# Patient Record
Sex: Female | Born: 1965 | Hispanic: No | Smoking: Never smoker
Health system: Southern US, Community
[De-identification: ages and names within clinical notes are randomized; demographics above are authoritative.]

## PROBLEM LIST (undated history)

## (undated) DIAGNOSIS — M1990 Unspecified osteoarthritis, unspecified site: Secondary | ICD-10-CM

## (undated) DIAGNOSIS — E079 Disorder of thyroid, unspecified: Secondary | ICD-10-CM

## (undated) DIAGNOSIS — J302 Other seasonal allergic rhinitis: Secondary | ICD-10-CM

## (undated) HISTORY — DX: Unspecified osteoarthritis, unspecified site: M19.90

## (undated) HISTORY — DX: Other seasonal allergic rhinitis: J30.2

## (undated) HISTORY — DX: Disorder of thyroid, unspecified: E07.9

---

## 2003-12-28 ENCOUNTER — Other Ambulatory Visit: Admission: RE | Admit: 2003-12-28 | Discharge: 2003-12-28 | Payer: Self-pay | Admitting: Gynecology

## 2004-07-20 ENCOUNTER — Ambulatory Visit (HOSPITAL_COMMUNITY): Admission: RE | Admit: 2004-07-20 | Discharge: 2004-07-20 | Payer: Self-pay | Admitting: Chiropractic Medicine

## 2004-12-24 ENCOUNTER — Other Ambulatory Visit: Admission: RE | Admit: 2004-12-24 | Discharge: 2004-12-24 | Payer: Self-pay | Admitting: Obstetrics and Gynecology

## 2005-01-30 ENCOUNTER — Encounter: Admission: RE | Admit: 2005-01-30 | Discharge: 2005-01-30 | Payer: Self-pay | Admitting: Obstetrics and Gynecology

## 2006-03-21 ENCOUNTER — Other Ambulatory Visit: Admission: RE | Admit: 2006-03-21 | Discharge: 2006-03-21 | Payer: Self-pay | Admitting: Obstetrics and Gynecology

## 2007-06-10 ENCOUNTER — Encounter: Admission: RE | Admit: 2007-06-10 | Discharge: 2007-06-10 | Payer: Self-pay | Admitting: Endocrinology

## 2009-05-17 ENCOUNTER — Encounter: Admission: RE | Admit: 2009-05-17 | Discharge: 2009-05-17 | Payer: Self-pay | Admitting: Obstetrics and Gynecology

## 2010-12-23 ENCOUNTER — Encounter: Payer: Self-pay | Admitting: Obstetrics and Gynecology

## 2010-12-24 ENCOUNTER — Encounter: Payer: Self-pay | Admitting: Obstetrics and Gynecology

## 2015-12-29 ENCOUNTER — Encounter (INDEPENDENT_AMBULATORY_CARE_PROVIDER_SITE_OTHER): Payer: Self-pay | Admitting: Foot & Ankle Surgery

## 2015-12-29 ENCOUNTER — Ambulatory Visit (INDEPENDENT_AMBULATORY_CARE_PROVIDER_SITE_OTHER): Payer: BC Managed Care – PPO | Admitting: Foot & Ankle Surgery

## 2015-12-29 ENCOUNTER — Ambulatory Visit (INDEPENDENT_AMBULATORY_CARE_PROVIDER_SITE_OTHER): Payer: Self-pay | Admitting: Foot & Ankle Surgery

## 2015-12-29 VITALS — BP 134/70 | HR 68 | Ht 69.0 in | Wt 162.0 lb

## 2015-12-29 DIAGNOSIS — L851 Acquired keratosis [keratoderma] palmaris et plantaris: Secondary | ICD-10-CM

## 2015-12-29 DIAGNOSIS — M2022 Hallux rigidus, left foot: Secondary | ICD-10-CM | POA: Insufficient documentation

## 2015-12-29 DIAGNOSIS — M722 Plantar fascial fibromatosis: Secondary | ICD-10-CM

## 2015-12-29 NOTE — Progress Notes (Signed)
This is a 50 year old female.  She is a new patient.  She is here with a  chief complaint of left heel pain.  She feels like the pain has been  present for the last 2 weeks.  She denies any injury or trauma to the area.   She has had no swelling or bruising.  She has had a history of plantar  fasciitis in the past.  She got rid of it with stretching and wearing  better shoes and using arch supports.  She feels like it may be something  similar.  She also has some painful calluses on the balls of her feet.  She  had them trimmed down 4 or 5 years ago which seemed to really help with her  symptoms.  They keep coming back despite treatment.  She would like to have  these removed today.  She is having surgery likely this year with Dr. Jim Desanctis  to clean up her left great toe joint, which seems to be stiff and  arthritic.  She feels like as her motion is decreased in the big toe, the  heel pain has gotten a little bit worse.     All other systems reviewed and negative.     PHYSICAL EXAMINATION:  GENERAL:  She is awake, alert, oriented x3, no acute distress.  She is a  well-developed, well-nourished female.  LOWER EXTREMITY EXAM:  She has strong DP and PT pulses.  Capillary fill  time is immediate.  No edema, no ecchymosis or varicosities.  Skin is well  hydrated and intact.  She has multiple calluses on the ball of her foot  under the second and third metatarsal area which are somewhat painful with  palpation.  Post-debridement no evidence of skin breakdown.  She has  limited range of motion in the left great toe but no significant crepitus.   It is tender, and there is some bony spurring dorsally.  She has quite  significant tenderness through the plantar left medial heel at the  insertion of the plantar fascia and some mild equinus.  Muscle strength is  5/5 and symmetric.  She has no pain on the right foot.  Her sensation is  intact to light touch.  Negative Tinel sign.     ASSESSMENT:  A 50 year old female with left  plantar fasciitis, left hallux rigidus, and  painful calluses bilateral feet.     PLAN:  For the plantar fasciitis, we discussed conservative care today.  She is  wearing a good pair of shoes.  I have given her some other options for  over-the-counter arch supports including SuperFeet.  We discussed use in  breaking these in.  I have also given her a home stretching exercise  program today to do 3 times daily.  We discussed massage and ice and  anti-inflammatories if she needs them.  If she is not significantly better,  she may benefit from a steroid injection if she sees no improvement over  the next month.     A total of 4 calluses were sharply debrided from the balls of her bilateral  feet with a #10 scalpel today.  She had immediate improvement of her pain  post-debridement.     She can follow up with me on her plantar fasciitis or Dr. Jim Desanctis who is  going to be addressing her hallux rigidus on the left foot.

## 2018-03-31 ENCOUNTER — Other Ambulatory Visit (INDEPENDENT_AMBULATORY_CARE_PROVIDER_SITE_OTHER): Payer: Self-pay | Admitting: Physician Assistant

## 2019-05-19 LAB — COMPREHENSIVE METABOLIC PANEL
ALT: 20 IU/L (ref 0–32)
AST (SGOT): 21 IU/L (ref 0–40)
Albumin/Globulin Ratio: 1.8 (ref 1.2–2.2)
Albumin: 4.5 g/dL (ref 3.5–5.5)
Alkaline Phosphatase: 56 IU/L (ref 39–117)
BUN / Creatinine Ratio: 18 (ref 9–23)
BUN: 15 mg/dL (ref 6–24)
Bilirubin, Total: 0.6 mg/dL (ref 0.0–1.2)
CO2: 26 mmol/L (ref 20–29)
Calcium: 9.4 mg/dL (ref 8.7–10.2)
Chloride: 100 mmol/L (ref 96–106)
Creatinine: 0.82 mg/dL (ref 0.57–1.00)
EGFR: 82 mL/min/{1.73_m2} (ref >59)
EGFR: 94 mL/min/{1.73_m2} (ref >59)
Globulin, Total: 2.5 g/dL (ref 1.5–4.5)
Glucose: 94 mg/dL (ref 65–99)
Potassium: 4.1 mmol/L (ref 3.5–5.2)
Protein, Total: 7 g/dL (ref 6.0–8.5)
Sodium: 139 mmol/L (ref 134–144)

## 2019-05-19 LAB — LIPID PANEL, WITHOUT TOTAL CHOLESTEROL/HDL RATIO, SERUM
Cholesterol: 182 mg/dL (ref 100–199)
HDL: 72 mg/dL (ref >39)
LDL Calculated: 96 mg/dL (ref 0–99)
Triglycerides: 69 mg/dL (ref 0–149)
VLDL Calculated: 14 mg/dL (ref 5–40)

## 2019-05-19 LAB — CBC AND DIFFERENTIAL
Baso(Absolute): 0 10*3/uL (ref 0.0–0.2)
Basos: 0 %
Eos: 1 %
Eosinophils Absolute: 0 10*3/uL (ref 0.0–0.4)
Hematocrit: 44 % (ref 34.0–46.6)
Hemoglobin: 14.7 g/dL (ref 11.1–15.9)
Immature Granulocytes Absolute: 0 10*3/uL (ref 0.0–0.1)
Immature Granulocytes: 0 %
Lymphocytes Absolute: 2.2 10*3/uL (ref 0.7–3.1)
Lymphocytes: 49 %
MCH: 32 pg (ref 26.6–33.0)
MCHC: 33.4 g/dL (ref 31.5–35.7)
MCV: 96 fL (ref 79–97)
Monocytes Absolute: 0.4 10*3/uL (ref 0.1–0.9)
Monocytes: 8 %
Neutrophils Absolute: 1.9 10*3/uL (ref 1.4–7.0)
Neutrophils: 42 %
Platelets: 226 10*3/uL (ref 150–379)
RBC: 4.59 x10E6/uL (ref 3.77–5.28)
RDW: 12.6 % (ref 12.3–15.4)
WBC: 4.5 10*3/uL (ref 3.4–10.8)

## 2019-05-19 LAB — HEMOGLOBIN A1C: Hemoglobin A1C: 5 % (ref 4.8–5.6)

## 2019-05-19 LAB — TSH: TSH: 1.98 u[IU]/mL (ref 0.450–4.500)

## 2019-06-17 ENCOUNTER — Other Ambulatory Visit: Payer: Self-pay

## 2019-06-17 ENCOUNTER — Other Ambulatory Visit: Payer: Self-pay | Admitting: Obstetrics and Gynecology

## 2019-06-17 ENCOUNTER — Ambulatory Visit: Payer: Self-pay

## 2019-06-17 ENCOUNTER — Ambulatory Visit
Admission: RE | Admit: 2019-06-17 | Discharge: 2019-06-17 | Disposition: A | Discharge status: Home / Self Care | Payer: BC Managed Care – PPO | Source: Ambulatory Visit | Attending: Obstetrics and Gynecology | Admitting: Obstetrics and Gynecology

## 2019-06-17 DIAGNOSIS — R928 Other abnormal and inconclusive findings on diagnostic imaging of breast: Secondary | ICD-10-CM

## 2019-06-18 ENCOUNTER — Other Ambulatory Visit: Payer: Self-pay

## 2019-11-18 ENCOUNTER — Other Ambulatory Visit (INDEPENDENT_AMBULATORY_CARE_PROVIDER_SITE_OTHER): Payer: Self-pay | Admitting: Family Medicine

## 2020-01-05 ENCOUNTER — Encounter (INDEPENDENT_AMBULATORY_CARE_PROVIDER_SITE_OTHER): Payer: Self-pay | Admitting: Family Medicine

## 2021-04-04 ENCOUNTER — Ambulatory Visit: Payer: No Typology Code available for payment source | Attending: Family Medicine | Admitting: Physical Therapy

## 2021-04-04 ENCOUNTER — Other Ambulatory Visit: Payer: Self-pay

## 2021-04-04 ENCOUNTER — Encounter: Payer: Self-pay | Admitting: Physical Therapy

## 2021-04-04 DIAGNOSIS — M542 Cervicalgia: Secondary | ICD-10-CM | POA: Insufficient documentation

## 2021-04-04 DIAGNOSIS — M25511 Pain in right shoulder: Secondary | ICD-10-CM | POA: Insufficient documentation

## 2021-04-04 DIAGNOSIS — M6281 Muscle weakness (generalized): Secondary | ICD-10-CM | POA: Diagnosis present

## 2021-04-04 DIAGNOSIS — G8929 Other chronic pain: Secondary | ICD-10-CM | POA: Insufficient documentation

## 2021-04-04 NOTE — Patient Instructions (Signed)
Access Code: [email or web address] URL: [email or web address] Date: 04/04/2021 Prepared by: Karie Mainland  Exercises Seated Cervical Sidebending Stretch - 1-2 x daily - 7 x weekly - 1 sets - 3 reps - 30sec hold Gentle Levator Scapulae Stretch - 1-2 x daily - 7 x weekly - 1 sets - 3 reps - 30 hold Sidelying Thoracic Lumbar Rotation - 1-2 x daily - 7 x weekly - 2 sets - 10 reps - 30 hold Thoracic Extension Mobilization on Foam Roll - 1 x daily - 7 x weekly - 1 sets - 3-5 reps - 10 hold Doorway Pec Stretch at 90 Degrees Abduction - 1-2 x daily - 7 x weekly - 1 sets - 3 reps - 30 hold

## 2021-04-04 NOTE — Therapy (Addendum)
[place], [place], [address] Phone: [phone]   Fax:  [phone]  Physical Therapy Evaluation/Addended Discharge  Patient Details  Name: Erika Schultz MRN: [id number] Date of Birth: October 19, 1966 Referring Provider (PT): Dr. Melissa Montane   Encounter Date: 04/04/2021   PT End of Session - 04/04/21 1429     Visit Number 1    Number of Visits 8    Date for PT Re-Evaluation 05/30/21    Authorization Type [employer]    PT Start Time 1340    PT Stop Time 1430    PT Time Calculation (min) 50 min    Activity Tolerance Patient tolerated treatment well    Behavior During Therapy [facility] for tasks assessed/performed             History reviewed. No pertinent past medical history.  History reviewed. No pertinent surgical history.  There were no vitals filed for this visit.    Subjective Assessment - 04/04/21 1343     Subjective Patient has had chronic neck/shoulder pain which has been an issue for 2 + yrs.  She noticed a correlation with texting, using her phone, driving and lifting bags.  She had PT 2 yrs ago with resolution of sx with PT in [place] . She has continued to exercise but not her PT exercise.  She has noticed decr grip in Rt UE and hand.  Denies sensory changes. Pain has radiated to middle arm but not now. I want someone to get in there and dig on it.  Has had massages but no one has worked it "like you guys"    Pertinent History none    Limitations Reading;Other (comment)   certain WB exercises   Diagnostic tests none    Patient Stated Goals I want to get back to where I was so that I can get up from the ground, be balanced, resources for maintenance    Currently in Pain? Yes    Pain Score 1     Pain Location Shoulder    Pain Orientation Right    Pain Descriptors / Indicators Discomfort    Pain Type Chronic pain    Pain Radiating Towards none    Pain Onset More than a month ago    Pain  Frequency Constant    Aggravating Factors  pohone, computer    Pain Relieving Factors stretch, PT, heating pad, massage    Effect of Pain on Daily Activities limits her comfort, sleep    Multiple Pain Sites No                [facility] PT Assessment - 04/04/21 0001       Assessment   Medical Diagnosis cervicalgia, strain Rt side    Referring Provider (PT) Dr. Melissa Montane    Onset Date/Surgical Date --   chronic   Hand Dominance Right    Next MD Visit follow up as needed    Prior Therapy Yes      Precautions   Precautions None      Restrictions   Weight Bearing Restrictions No      Balance Screen   Has the patient fallen in the past 6 months No      [place] residence    Living Arrangements Spouse/significant other      Prior Function   Level of Independence Independent    Vocation Works at home    Leisure outdoors, boating, hiking  Cognition   Overall Cognitive Status Within Functional Limits for tasks assessed      Observation/Other Assessments   Focus on Therapeutic Outcomes (FOTO)  57%      Sensation   Light Touch Appears Intact      Coordination   Gross Motor Movements are Fluid and Coordinated Not tested      Posture/Postural Control   Posture/Postural Control Postural limitations    Postural Limitations Decreased thoracic kyphosis    Posture Comments good sitting posture, Rt shoulder lower      AROM   Overall AROM Comments WFL    Cervical Flexion 54   tightness   Cervical Extension 65    Cervical - Right Side Bend 40    Cervical - Left Side Bend 40    Cervical - Right Rotation WNL    Cervical - Left Rotation WNL   L slightly less than R     PROM   Overall PROM Comments WNL, mild pain Rt and Lt end range Er/Abd combined      Strength   Overall Strength Comments Rt 4/5 with pain, bilateral horizontal abd 4/5    Right Hand Grip (lbs) 68.7   average of 69,65,72   Left Hand Grip (lbs) 57   average of  58,58,55     Palpation   Spinal mobility hypomobile in middle to upper thoracic, and cervical spine    Palpation comment tender Rt upper trap, levator scap and Rt suboccipital, Rt supraspinatus painful      Special Tests    Special Tests Cervical;Rotator Cuff Impingement      Spurling's   Findings Negative      Vertebral Artery Test    Findings Negative      Neer Impingement test    Findings Positive    Side Right                        Objective measurements completed on examination: See above findings.       [facility] Adult PT Treatment/Exercise - 04/04/21 0001       Self-Care   Self-Care Posture;Other Self-Care Comments    Posture sitting, ergonomics, pillow for neck support, positoning    Other Self-Care Comments  HEP, massage and yoga      Shoulder Exercises: Sidelying   Other Sidelying Exercises open book (HEP)      Neck Exercises: Stretches   Upper Trapezius Stretch Limitations HEP    Levator Stretch Limitations HEP    Corner Stretch Limitations HEP                    PT Education - 04/04/21 1451     Education Details PT/POC, DN, see flowsheet    Person(s) Educated Patient    Methods Explanation;Demonstration;Handout;Verbal cues    Comprehension Verbalized understanding;Returned demonstration              PT Short Term Goals - 04/04/21 1452       PT SHORT TERM GOAL #1   Title Pt will understand FOTO score and potential to improve her condition    Baseline 57%    Time 2    Period Weeks    Status New    Target Date 04/18/21      PT SHORT TERM GOAL #2   Title Pt will be I with initial HEP for shoulder strength, mobility    Baseline given today    Time 2    Period Weeks  Status New    Target Date 04/18/21      PT SHORT TERM GOAL #3   Title Pt will notice less pain with posture when working with phone and computer    Baseline has good baseline awareness    Time 2    Period Weeks    Status New    Target Date  04/18/21               PT Long Term Goals - 04/04/21 1453       PT LONG TERM GOAL #1   Title Pt will be I with HEP for posture, spine mobility and UE strength    Baseline unknown    Time 8    Period Weeks    Status New    Target Date 05/30/21      PT LONG TERM GOAL #2   Title FOTO score will improve to 73% to demo improved functional mobility    Baseline 57%    Time 8    Period Weeks    Status New    Target Date 05/30/21      PT LONG TERM GOAL #3   Title Pt will be able to sleep on Rt side without increased shoulder, neck pain    Baseline unable, pain min to moderate    Time 8    Period Weeks    Status New    Target Date 05/30/21      PT LONG TERM GOAL #4   Title Pt will be able lift, reach with Rt arm for grocery bags , mod sized items.    Time 8    Period Weeks    Status New    Target Date 05/30/21      PT LONG TERM GOAL #5   Title Pt will be able to exercise for full body fitness without increased shoulder pain, including Rt UE weightbearing/planks.    Time 8    Period Weeks    Status New    Target Date 05/30/21                    Plan - 04/04/21 1454     Clinical Impression Statement Patient presents for low complexity eval of Rt sided neck and shoulder pain which is consistent with chronic shoulder impingement with myofascial involvement.  She has mild weakness in Rt shoulder, stiffness in cervical and thoracic spine.  Occasional numbness and tingling in 4th 5th digits.  Cervical spine does not seem to be the source of her pain. Pos . impingement testing in Rt UE.  She has in the past, fully restored her mobility and is hopeful she will be able to do this again.  She is motivated and will benefit from skilled PT for corrective exercise and manual therapy.    Personal Factors and Comorbidities Time since onset of injury/illness/exacerbation    Examination-Activity Limitations Lift;Sleep;Reach Overhead;Transfers    Examination-Participation  Restrictions Community Activity;Occupation;Interpersonal Relationship;Cleaning    Stability/Clinical Decision Making Stable/Uncomplicated    Clinical Decision Making Low    Rehab Potential Excellent    PT Frequency 1x / week    PT Duration 8 weeks    PT Treatment/Interventions ADLs/Self Care Home Management;Electrical Stimulation;Spinal Manipulations;Cryotherapy;Moist Heat;Iontophoresis 66m/ml Dexamethasone;Neuromuscular re-education;Therapeutic exercise;Dry needling;Manual techniques;Patient/family education;Passive range of motion;Taping;Therapeutic activities;Ultrasound    PT Next Visit Plan check HEP, manual,.DN begin strength/closed chain , Pilates?    PT Home Exercise Plan Access Code: [email or web address]   Consulted and Agree with Plan of  Care Patient             Patient will benefit from skilled therapeutic intervention in order to improve the following deficits and impairments:  Decreased mobility,Hypomobility,Pain,Impaired UE functional use,Increased fascial restricitons,Impaired flexibility,Decreased strength  Visit Diagnosis: Cervicalgia  Chronic right shoulder pain  Muscle weakness (generalized)     Problem List There are no problems to display for this patient.   Denman Pichardo 04/04/2021, 3:13 PM  [place], [place], [address] Phone: [phone]   Fax:  [phone]  Name: NAYLEEN JANOSIK MRN: [id number] Date of Birth: October 05, 1966   Raeford Razor, PT 04/04/21 3:13 PM Phone: [phone] Fax: [phone]   PHYSICAL THERAPY DISCHARGE SUMMARY  Visits from Start of Care: 1  Current functional level related to goals / functional outcomes: See above     Remaining deficits: Unknown    Education / Equipment: HEP   Patient agrees to discharge. Patient goals were not met. Patient is being discharged due to not returning since the last visit.  Raeford Razor, PT 06/05/21 12:36 PM Phone:  [phone] Fax: [phone]

## 2021-08-29 ENCOUNTER — Other Ambulatory Visit: Payer: Self-pay | Admitting: Obstetrics and Gynecology

## 2021-08-29 DIAGNOSIS — R928 Other abnormal and inconclusive findings on diagnostic imaging of breast: Secondary | ICD-10-CM

## 2021-09-18 ENCOUNTER — Other Ambulatory Visit: Payer: No Typology Code available for payment source

## 2021-09-25 ENCOUNTER — Other Ambulatory Visit: Payer: No Typology Code available for payment source

## 2021-09-26 ENCOUNTER — Ambulatory Visit: Admission: RE | Admit: 2021-09-26 | Payer: No Typology Code available for payment source | Source: Ambulatory Visit

## 2021-09-26 ENCOUNTER — Ambulatory Visit
Admission: RE | Admit: 2021-09-26 | Discharge: 2021-09-26 | Disposition: A | Discharge status: Home / Self Care | Payer: No Typology Code available for payment source | Source: Ambulatory Visit | Attending: Obstetrics and Gynecology | Admitting: Obstetrics and Gynecology

## 2021-09-26 DIAGNOSIS — R928 Other abnormal and inconclusive findings on diagnostic imaging of breast: Secondary | ICD-10-CM

## 2022-02-20 DIAGNOSIS — J309 Allergic rhinitis, unspecified: Secondary | ICD-10-CM | POA: Diagnosis not present

## 2022-02-20 DIAGNOSIS — M542 Cervicalgia: Secondary | ICD-10-CM | POA: Diagnosis not present

## 2022-02-20 DIAGNOSIS — B001 Herpesviral vesicular dermatitis: Secondary | ICD-10-CM | POA: Diagnosis not present

## 2022-02-20 DIAGNOSIS — N393 Stress incontinence (female) (male): Secondary | ICD-10-CM | POA: Diagnosis not present

## 2022-02-20 DIAGNOSIS — G8929 Other chronic pain: Secondary | ICD-10-CM | POA: Diagnosis not present

## 2022-02-20 DIAGNOSIS — N951 Menopausal and female climacteric states: Secondary | ICD-10-CM | POA: Diagnosis not present

## 2022-02-20 DIAGNOSIS — M25511 Pain in right shoulder: Secondary | ICD-10-CM | POA: Diagnosis not present

## 2022-02-20 DIAGNOSIS — M79645 Pain in left finger(s): Secondary | ICD-10-CM | POA: Diagnosis not present

## 2022-02-20 DIAGNOSIS — M79644 Pain in right finger(s): Secondary | ICD-10-CM | POA: Diagnosis not present

## 2022-03-05 ENCOUNTER — Encounter: Payer: Self-pay | Admitting: Occupational Therapy

## 2022-03-05 ENCOUNTER — Ambulatory Visit: Payer: 59 | Attending: Internal Medicine | Admitting: Occupational Therapy

## 2022-03-05 DIAGNOSIS — R279 Unspecified lack of coordination: Secondary | ICD-10-CM | POA: Diagnosis not present

## 2022-03-05 DIAGNOSIS — M6281 Muscle weakness (generalized): Secondary | ICD-10-CM | POA: Insufficient documentation

## 2022-03-05 DIAGNOSIS — M79641 Pain in right hand: Secondary | ICD-10-CM | POA: Insufficient documentation

## 2022-03-05 DIAGNOSIS — M79642 Pain in left hand: Secondary | ICD-10-CM | POA: Diagnosis not present

## 2022-03-05 NOTE — Therapy (Addendum)
?OUTPATIENT OCCUPATIONAL THERAPY ORTHO EVALUATION ? ?Patient Name: Erika Schultz ?MRN: [id number] ?DOB:01/18/1966, 56 y.o., female ?Today's Date: 03/05/2022 ? ?PCP: Ollen BowlPahwani, Rinka R, MD ?REFERRING PROVIDER: Ollen BowlPahwani, Rinka R, MD ? ? OT End of Session - 03/05/22 1206   ? ? Visit Number 1   ? Number of Visits 10   ? Date for OT Re-Evaluation 04/16/22   1-2x/week over 6 weeks  ? Authorization Type Unknown at evaluation   ? OT Start Time 1145   ? OT Stop Time 1230   ? OT Time Calculation (min) 45 min   ? Activity Tolerance Patient tolerated treatment well   ? [facility] assessed/performed   ? ?  ?  ? ?  ? ? ?History reviewed. No pertinent past medical history. ?History reviewed. No pertinent surgical history. ?There are no problems to display for this patient. ? ? ?ONSET DATE: 02/21/22 referral date ? 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 ?THERAPY DIAG:  ?Pain in left hand - Plan: Ot plan of care cert/re-cert ? ?Pain in right hand - Plan: Ot plan of care cert/re-cert ? ?SUBJECTIVE:  ? ?SUBJECTIVE STATEMENT: ?Pt is a 56 year old female that presents to OPOT with chronic bilateral thumb pain. Pt report originally get diagnosis of bilateral arthritis approx 5 years ago but 2 years ago an Xray determined no arthritis. Pt reports she has 2 braces that she does not wear consistently but they help when she does wear them. Pt reports difficulty with opening jars, texting, push ups and increased pain with 3 point pinch testing. Pt reports increased pain with RUE > LUE.  ? ?Pt accompanied by: self ? ?PERTINENT HISTORY: None ? ?PRECAUTIONS: None, has 2 CMC thumb splints BUE - does not wear consistently ? ?WEIGHT BEARING RESTRICTIONS No ? ?PAIN:  ?Are you having pain? Yes: NPRS scale: 1/10 ?Pain location: BUE thumbs ?Pain description: stabbing with use 8/10 (texting), achy all the time ?Aggravating factors: texting/use ?Relieving factors: rest, Voltaren gel, wearing the splints ? ?FALLS: Has patient  fallen in last 6 months? No ? ?LIVING ENVIRONMENT: ?Lives with: lives with their family, lives with their spouse, and puppy ?Lives in: House/apartment ? ?PLOF: Independent, Vocation/Vocational requirements: unemployed, and Leisure: workout, yoga, pickleball ? ?PATIENT GOALS "to completely get rid of my pain, help make it go away" ? ?OBJECTIVE:  ? ?HAND DOMINANCE: Right ? ?ADLs: ?Overall ADLs: Independent with ADLs ? ?FUNCTIONAL OUTCOME MEASURES: ?Quick Dash: 13.6% difficulty ? ?UE ROM    ? ?Active ROM Right ?03/05/2022 Left ?03/05/2022  ?Shoulder flexion WFL  ?Shoulder abduction   ?Shoulder adduction   ?Shoulder extension   ?Shoulder internal rotation   ?Shoulder external rotation   ?Elbow flexion   ?Elbow extension   ?Wrist flexion   ?Wrist extension   ?Wrist ulnar deviation   ?Wrist radial deviation   ?Wrist pronation   ?Wrist supination   ?(Blank rows = not tested) ? ?Active ROM Right ?03/05/2022 Left ?03/05/2022  ?Thumb MCP (0-60)   ?  WFL ?    ?Thumb IP (0-80)   ?Thumb Radial abd/add (0-55)   ?Thumb Palmar abd/add (0-45)   ?Thumb Opposition to Small Finger   ?Index MCP (0-90)   ?Index PIP (0-100)   ?Index DIP (0-70)   ?Long MCP (0-90)   ?Long PIP (0-100)   ?Long DIP (0-70)   ?Ring MCP (0-90)   ?Ring PIP (0-100)   ?Ring DIP (0-70)   ?Little MCP (0-90)   ?Little  PIP (0-100)   ?Little DIP (0-70)   ?(Blank rows = not tested) ? ? ?UE MMT:    ? ?MMT Right ?03/05/2022 Left ?03/05/2022  ?Shoulder flexion WFL  ?Shoulder abduction   ?Shoulder adduction   ?Shoulder extension   ?Shoulder internal rotation   ?Shoulder external rotation   ?Middle trapezius   ?Lower trapezius   ?Elbow flexion   ?Elbow extension   ?Wrist flexion   ?Wrist extension   ?Wrist ulnar deviation   ?Wrist radial deviation   ?Wrist pronation   ?Wrist supination   ?(Blank rows = not tested) ? ?HAND FUNCTION: ?Grip strength: Right: 62.3 lbs; Left: 59.7 lbs ?Lateral pinch: Right: 13 lbs, Left: 14 lbs ?3 point pinch: Right: 12 lbs, Left: 10 lbs ?Tip pinch: Right 10  lbs, Left: 8 lbs ? ?COORDINATION: ?9 Hole Peg test: Right: 16.83 sec; Left: 22.11 sec ? ? ?SENSATION: ?Pt denies any sensory deficits ? ?EDEMA: None noted ? ?COGNITION: ?Overall cognitive status: Within functional limits for tasks assessed ?Areas of impairment:  none noted at eval ? ? ?TODAY'S TREATMENT:  ?Paraffin x 10 minutes BUE for pain relief and stiffness ? ? ?PATIENT EDUCATION: ?Education details: Education on role and purpose of OT ?Person educated: Patient ?Education method: Explanation ?Education comprehension: verbalized understanding ? ? ? ?GOALS: ?Goals reviewed with patient? Yes ? ?SHORT TERM GOALS: Target date:  only LTGs ? ? ?LONG TERM GOALS: Target date: 04/16/2022 ? ?Pt will be independent with HEP targeting BUE thumb AROM and strengthening ?Baseline:  ?Goal status: INITIAL ? ?2.  Pt will verbalize understanding of wear and care instructions of any splint and/or brace PRN. ?Baseline:  currently has 2 CMC thumb braces BUE ?Goal status: INITIAL ? ?3.  Pt will verbalize understanding of pain management strategies ?Baseline:  ?Goal status: INITIAL ? ?4.  Pt will verbalize understanding of activity modifications and/or adapted strategies for increasing function and decreasing pain in BUE. ?Baseline:  ?Goal status: INITIAL ? ?5.  Pt will increase report pain no greater than 6/10 in BUE thumbs while completing tasks that typically would increase pain (I.e. texting) ?Baseline:  ?Goal status: INITIAL ? ? ? ?ASSESSMENT: ? ?CLINICAL IMPRESSION: ?Patient is a 56 y.o. female who was seen today for occupational therapy evaluation for bilateral thumb pain. Pt presents with deficits with BUE hands with increased pain, especially with activity and repetitive motion. Skilled occupational therapy is recommended to target listed areas of deficit and increase independence with ADLs and IADLs and decrease caregiver burden.   ? ?PERFORMANCE DEFICITS in functional skills including ADLs, coordination, dexterity, strength,  pain, FMC, GMC, decreased knowledge of use of DME, and UE functional use. ? ?IMPAIRMENTS are limiting patient from IADLs and leisure.  ? ?COMORBIDITIES has no other co-morbidities that affects occupational performance. Patient will benefit from skilled OT to address above impairments and improve overall function. ? ?MODIFICATION OR ASSISTANCE TO COMPLETE EVALUATION: No modification of tasks or assist necessary to complete an evaluation. ? ?OT OCCUPATIONAL PROFILE AND HISTORY: Problem focused assessment: Including review of records relating to presenting problem. ? ?CLINICAL DECISION MAKING: LOW - limited treatment options, no task modification necessary ? ?REHAB POTENTIAL: Good ? ?EVALUATION COMPLEXITY: Low ? ? ? ? ? ?PLAN: ?OT FREQUENCY: 1-2x/week ? ?OT DURATION: 6 weeks ? ?PLANNED INTERVENTIONS: therapeutic exercise, therapeutic activity, manual therapy, passive range of motion, splinting, electrical stimulation, ultrasound, iontophoresis, paraffin, fluidotherapy, moist heat, cryotherapy, contrast bath, patient/family education, energy conservation, and DME and/or AE instructions ? ?RECOMMENDED OTHER SERVICES: none at this  time ? ?CONSULTED AND AGREED WITH PLAN OF CARE: Patient ? ?PLAN FOR NEXT SESSION: see how paraffin went, continue paraffin, fluido or ultrasound, complete Finklestein's test and determine need for Ionto for possible DeQuervain's Syndrome ? ? ? ? ? ? ?Junious Dresser, OT ?03/05/2022, 2:29 PM ? ? ?

## 2022-03-12 ENCOUNTER — Ambulatory Visit: Payer: 59

## 2022-03-12 DIAGNOSIS — M6281 Muscle weakness (generalized): Secondary | ICD-10-CM

## 2022-03-12 DIAGNOSIS — M79642 Pain in left hand: Secondary | ICD-10-CM | POA: Diagnosis not present

## 2022-03-12 DIAGNOSIS — R279 Unspecified lack of coordination: Secondary | ICD-10-CM | POA: Diagnosis not present

## 2022-03-12 DIAGNOSIS — M79641 Pain in right hand: Secondary | ICD-10-CM | POA: Diagnosis not present

## 2022-03-12 NOTE — Patient Instructions (Signed)
Vulvar/vaginal Massage: This is a technique to help decrease painful sensitivity in the vaginal area. It can also help to restore normal moisture levels in the vaginal tissues. With coconut oil, aloe, jojoba oil, or a specific vaginal moisturizer, gently massage into vaginal tissues. Think of this as part of your post-shower routine and moisturizing just like you would the rest of the body with lotion. This helps to increase good blood flow to the vaginal tissues. In addition, it also teaches the body that touch to the vagina does not have to be painful or threatening, but moisturizing and gentle.     

## 2022-03-12 NOTE — Therapy (Signed)
?OUTPATIENT PHYSICAL THERAPY FEMALE PELVIC EVALUATION ? ? ?Patient Name: Erika Schultz ?MRN: [id number] ?DOB:07-13-1966, 56 y.o., female ?Today's Date: 03/12/2022 ? ? PT End of Session - 03/12/22 1324   ? ? Visit Number 1   ? Date for PT Re-Evaluation 06/04/22   ? Authorization Type Self pay   ? PT Start Time 1236   ? PT Stop Time 1319   ? PT Time Calculation (min) 43 min   ? Activity Tolerance Patient tolerated treatment well   ? Behavior During Therapy [facility] for tasks assessed/performed   ? ?  ?  ? ?  ? ? ?History reviewed. No pertinent past medical history. ?History reviewed. No pertinent surgical history. ?There are no problems to display for this patient. ? ? ?PCP: Mckinley Jewel, MD ? ?REFERRING PROVIDER: Mckinley Jewel, MD ? ?REFERRING DIAG: N39.3 (ICD-10-CM) - Stress incontinence (female) (female) ? ?THERAPY DIAG:  ?Muscle weakness (generalized) ? ?Unspecified lack of coordination ? ?ONSET DATE: 12/02/2001 ? ?SUBJECTIVE:                                                                                                                                                                                          ? ?SUBJECTIVE STATEMENT: ?Pt states that she has been having urinary leaking since having kids 20 years ago. She is active and plays pickle ball. ?Fluid intake: Yes: she drinks at least 64 oz a day; one cup of coffee in the morning ; she will drink sparkling waters and 2 glasses of wine a night ? ?Patient confirms identification and approves PT to assess pelvic floor and treatment Yes ? ? ?PAIN:  ?Are you having pain? No ? ?PRECAUTIONS: None ? ?WEIGHT BEARING RESTRICTIONS No ? ?FALLS:  ?Has patient fallen in last 6 months? No ? ?LIVING ENVIRONMENT: ?Lives with: lives with their family ?Lives in: House/apartment ? ?OCCUPATION: not working ? ?PLOF: Independent ? ?PATIENT GOALS to decrease leaking ? ?PERTINENT HISTORY:  ?hector allain - summer and fall of 2021 ?Sexual abuse: No ? ?BOWEL MOVEMENT ?Pain with bowel  movement: No ?Type of bowel movement:Frequency 1x/day and Strain No ?Fully empty rectum: Yes: - ?Leakage: No ?Pads: No ?Fiber supplement: No ? ?URINATION ?Pain with urination: No ?Fully empty bladder: Yes: - ?Stream: Strong ?Urgency: No - long time to wait ?Frequency: 4-6x/day; 1x/night ?Leakage: Coughing, Sneezing, Exercise, and pickle ball ?Pads: Yes: panty liner only when she goes to play pickle ball ? ?INTERCOURSE ?Pain with intercourse: Initial Penetration and During Penetration ?Ability to have vaginal penetration:  Yes: she states that she has some scar tissue on cervix that she uses vitamin E and coconut oil  suppository for ? ? ? ?PREGNANCY ?Vaginal deliveries 2 ?Tearing Yes: minimal ?C-section deliveries 0 ?Currently pregnant No ? ? ?OBJECTIVE:  ? ?COGNITION: ? Overall cognitive status: Within functional limits for tasks assessed   ? ?POSTURE:  ?Anterior pelvic tilt ? ?PELVIC MMT: strength 2/5, endurance 2 seconds, repetitions 4 times with gluteal co-activation and poor coordination ? ?      PALPATION: ?  General  2 finger width diastasis recti with coning/doming, no tenderness in abdomen ? ?              External Perineal Exam inability to bear down/bulge (paradoxical contraction) ?              ?              Internal Pelvic Floor mild redness in inferior introitis with tenderness/pinching ? ?TONE: ?WNL ? ?TODAY'S TREATMENT 03/12/22 ?EVAL  ?Manual: ?Soft tissue mobilization: ?Scar tissue mobilization: ?Myofascial release: ?Spinal mobilization: ?Internal pelvic floor techniques: ?Dry needling: ?Neuromuscular re-education: ?Core retraining:  ?Core facilitation: ?Form correction: ?Pelvic floor contraction training: ?Quick flicks [id number] with breath coordination ?Long holds 5  x 10 seconds ?Down training: ?Exercises: ?Stretches/mobility: ?Strengthening: ?Therapeutic activities: ?Functional strengthening activities: ?Self-care: ?Vulvovaignal massage ?Bladder irritants ?Pelvic floor anatomy and relationship between  abdominals and pelvic floor ? ? ? ?PATIENT EDUCATION:  ?Education details: See above self-care ?Person educated: Patient ?Education method: Explanation, Demonstration, Tactile cues, Verbal cues, and Handouts ?Education comprehension: verbalized understanding ? ? ?HOME EXERCISE PROGRAM: ?[id number] ? ?ASSESSMENT: ? ?CLINICAL IMPRESSION: ?Patient is a 56 y.o. female who was seen today for physical therapy evaluation and treatment for stress urinary incontinence and pain with intercourse. Exam findings notable for 2 finger width diastasis at umbilicus with distortion, poor pressure management, inability to bulge/bear down with paradoxical contraction, discoloration and tenderness over inferior introitis, pelvic floor weakness 2/5, decreased pelvic floor endurance 2 seconds, and poor repetition of contraction demonstrating decreased endurance. Signs and symptoms are most consistent with abdominal and pelvic floor weakness. She will benefit from skilled PT intervention in order to address impairments, reduce urinary incontinence with increased abdominal pressure, and improve QOL.  ? ? ?OBJECTIVE IMPAIRMENTS decreased activity tolerance, decreased coordination, decreased endurance, decreased strength, increased muscle spasms, postural dysfunction, and pain.  ? ?ACTIVITY LIMITATIONS  exercise; intercourse .  ? ?PERSONAL FACTORS 1 comorbidity: 2 vaginal deliveries  are also affecting patient's functional outcome.  ? ? ?REHAB POTENTIAL: Good ? ?CLINICAL DECISION MAKING: Stable/uncomplicated ? ?EVALUATION COMPLEXITY: Low ? ? ?GOALS: ?Goals reviewed with patient? Yes ? ?SHORT TERM GOALS: Target date: 04/09/2022 ? ?Pt will be independent with HEP.  ? ?Baseline: ?Goal status: INITIAL ? ?2.  Pt will be able to correctly perform diaphragmatic breathing and appropriate pressure management in order to prevent worsening vaginal wall laxity and improve pelvic floor A/ROM.  ? ?Baseline:  ?Goal status: INITIAL ? ?3.  Pt will be independent  with the knack decrease urinary incontinence.  ? ?Baseline:  ?Goal status: INITIAL ? ?LONG TERM GOALS: Target date: 06/04/2022 ? ?Pt will be independent with advanced HEP.  ? ?Baseline:  ?Goal status: INITIAL ? ?2.  Pt will report no leaks with pickle ball, exercise, laughing, coughing, sneezing in order to improve comfort with interpersonal relationships and community activities.  ? ?Baseline:  ?Goal status: INITIAL ? ?3.  Pt will demonstrate normal pelvic floor muscle tone and A/ROM, able to achieve 4/5 strength with contractions and 10 sec endurance, in order to provide appropriate lumbopelvic support in functional activities.  ? ?  Baseline:  ?Goal status: INITIAL ? ?4.  Pt will report 0/10 pain with vaginal penetration in order to improve intimate relationship with partner.   ? ?Baseline:  ?Goal status: INITIAL ? ? ?PLAN: ?PT FREQUENCY: 1x/week ? ?PT DURATION: 12 weeks ? ?PLANNED INTERVENTIONS: Therapeutic exercises, Therapeutic activity, Neuromuscular re-education, Balance training, Gait training, Patient/Family education, Joint mobilization, Dry Needling, Biofeedback, and Manual therapy ? ?PLAN FOR NEXT SESSION: Plan to begin core retraining program; pelvic floor strengthening in various functional positions.  ? ?Heather Roberts, PT, DPT04/11/231:34 PM ? ? ?

## 2022-03-13 ENCOUNTER — Ambulatory Visit: Payer: 59 | Admitting: Occupational Therapy

## 2022-03-13 ENCOUNTER — Encounter: Payer: Self-pay | Admitting: Occupational Therapy

## 2022-03-13 DIAGNOSIS — R279 Unspecified lack of coordination: Secondary | ICD-10-CM

## 2022-03-13 DIAGNOSIS — M79642 Pain in left hand: Secondary | ICD-10-CM | POA: Diagnosis not present

## 2022-03-13 DIAGNOSIS — M79641 Pain in right hand: Secondary | ICD-10-CM | POA: Diagnosis not present

## 2022-03-13 DIAGNOSIS — M6281 Muscle weakness (generalized): Secondary | ICD-10-CM | POA: Diagnosis not present

## 2022-03-13 NOTE — Patient Instructions (Addendum)
Opposition (Active) ° ° °Touch tip of thumb to nail tip of each finger in turn, making an "O" shape. °Repeat __10__ times. Do _4-6___ sessions per day. ° ° °MP Flexion (Active) ° ° °Bend thumb to touch base of little finger, keeping tip joint straight. °Repeat __10-15__ times. Do _4-6___ sessions per day. ° ° ° ° ° ° °IP Flexion (Active Blocked) ° ° °Brace thumb below tip joint. Bend joint as far as possible. °Repeat __10__ times. Do _4-6___ sessions per day. ° ° °Composite Extension (Active) ° ° °Bring thumb up and out in hitchhiker position.  °Repeat __10-15__ times. Do _4-6___ sessions per day. ° °  °

## 2022-03-13 NOTE — Therapy (Signed)
?OUTPATIENT OCCUPATIONAL THERAPY TREATMENT NOTE ? ? ?Patient Name: Erika Schultz ?MRN: [id number] ?DOB:[birth year], 56 y.o., female ?Today's Date: 03/13/2022 ? ?PCP: Mckinley Jewel, MD ?REFERRING PROVIDER: Mckinley Jewel, MD ? ?END OF SESSION:  ? OT End of Session - 03/13/22 1322   ? ? Visit Number 2   ? Number of Visits 10   ? Date for OT Re-Evaluation 04/16/22   1-2x/week over 6 weeks  ? Authorization Type Unknown at evaluation   ? OT Start Time 1320   ? OT Stop Time 1400   ? OT Time Calculation (min) 40 min   ? Activity Tolerance Patient tolerated treatment well   ? Behavior During Therapy [facility] for tasks assessed/performed   ? ?  ?  ? ?  ? ? ?History reviewed. No pertinent past medical history. ?History reviewed. No pertinent surgical history. ?There are no problems to display for this patient. ? ? ?ONSET DATE: 02/21/22 referral date ? 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 ?THERAPY DIAG:  ?Muscle weakness (generalized) ? ?Unspecified lack of coordination ? ?Pain in left hand ? ?Pain in right hand ? ? ?PERTINENT HISTORY: None ? ?PRECAUTIONS: None, has 2 CMC thumb splints BUE - does not wear consistently ? ?SUBJECTIVE: I brought my braces ? ?PAIN:  ?Are you having pain? Yes: NPRS scale: 1-2/10 ?Pain location: BUE thumbs ?Pain description: sore ?Aggravating factors: overuse ?Relieving factors: rest ? ? ? ? ?OBJECTIVE:  ? ?TODAY'S TREATMENT: ? ?03/13/22 ? ?Finkelstein Test - negative BUE ? ?Fluidotherapy x 12 minutes for BUE to address pain, swelling, and stiffness. No adverse reactions.  ? ?AROM Thumb ?Opposition (Active) ? ? ?Touch tip of thumb to nail tip of each finger in turn, making an "O" shape. ?Repeat __10__ times. Do _4-6___ sessions per day. ? ? ?MP Flexion (Active) ? ? ?Bend thumb to touch base of little finger, keeping tip joint straight. ?Repeat __10-15__ times. Do _4-6___ sessions per day. ? ? ? ? ? ? ?IP Flexion (Active Blocked) ? ? ?Brace thumb below tip joint. Bend joint as far as  possible. ?Repeat __10__ times. Do _4-6___ sessions per day. ? ? ?Composite Extension (Active) ? ? ?Bring thumb up and out in hitchhiker position.  ?Repeat __10-15__ times. Do _4-6___ sessions per day. ? ? ? ? ?Ultrasound 0.8w/cm2, 1 mhz, 10 minutes total (5 minutes both sides), pulsed 10% duty cycle to BUE thenar and CMC joint   ? ? ?PATIENT EDUCATION: ?Education details: issued AROM thumb ?Person educated: Patient ?Education method: Explanation and Demonstration ?Education comprehension: verbalized understanding and returned demonstration ? ?HOME EXERCISE PROGRAM: ?03/13/22 Issued AROM THUMB ? ?GOALS: ?Goals reviewed with patient? Yes ?  ?SHORT TERM GOALS: Target date:  only LTGs ?  ?  ?LONG TERM GOALS: Target date: 04/16/2022 ?  ?Pt will be independent with HEP targeting BUE thumb AROM and strengthening ?Baseline:  ?Goal status: INITIAL ?  ?2.  Pt will verbalize understanding of wear and care instructions of any splint and/or brace PRN. ?Baseline:  currently has 2 CMC thumb braces BUE ?Goal status: ONGOING ?  ?3.  Pt will verbalize understanding of pain management strategies ?Baseline:  ?Goal status: ONGOING ?  ?4.  Pt will verbalize understanding of activity modifications and/or adapted strategies for increasing function and decreasing pain in BUE. ?Baseline:  ?Goal status: INITIAL ?  ?5.  Pt will increase report pain no greater than 6/10 in BUE thumbs while completing tasks that typically would increase pain (I.e. texting) ?Baseline:  ?  Goal status: ONGOING ?  ?  ?  ?ASSESSMENT: ?  ?CLINICAL IMPRESSION: ?Pt reports enjoying the paraffin. No adverse reactions from fluido and ultrasound today. Negative Finkelstein test.  ?  ?PERFORMANCE DEFICITS in functional skills including ADLs, coordination, dexterity, strength, pain, McGrath, Morse, decreased knowledge of use of DME, and UE functional use. ?  ?IMPAIRMENTS are limiting patient from IADLs and leisure.  ?  ?COMORBIDITIES has no other co-morbidities that affects  occupational performance. Patient will benefit from skilled OT to address above impairments and improve overall function. ?  ?MODIFICATION OR ASSISTANCE TO COMPLETE EVALUATION: No modification of tasks or assist necessary to complete an evaluation. ?  ?OT OCCUPATIONAL PROFILE AND HISTORY: Problem focused assessment: Including review of records relating to presenting problem. ?  ?CLINICAL DECISION MAKING: LOW - limited treatment options, no task modification necessary ?  ?REHAB POTENTIAL: Good ?  ?EVALUATION COMPLEXITY: Low ?  ?  ?  ?  ?  ?PLAN: ?OT FREQUENCY: 1-2x/week ?  ?OT DURATION: 6 weeks ?  ?PLANNED INTERVENTIONS: therapeutic exercise, therapeutic activity, manual therapy, passive range of motion, splinting, electrical stimulation, ultrasound, iontophoresis, paraffin, fluidotherapy, moist heat, cryotherapy, contrast bath, patient/family education, energy conservation, and DME and/or AE instructions ?  ?RECOMMENDED OTHER SERVICES: none at this time ?  ?CONSULTED AND AGREED WITH PLAN OF CARE: Patient ?  ?PLAN FOR NEXT SESSION: fluido or ultrasound, maybe paraffin instead?, review AROM thumb exercises,  ?  ?  ? ?Zachery Conch, OT ?03/13/2022, 2:00 PM ? ?  ? ? ? ?

## 2022-03-20 ENCOUNTER — Ambulatory Visit: Payer: 59 | Admitting: Occupational Therapy

## 2022-03-21 ENCOUNTER — Ambulatory Visit: Payer: 59

## 2022-03-22 ENCOUNTER — Ambulatory Visit: Payer: 59 | Admitting: Occupational Therapy

## 2022-03-28 ENCOUNTER — Ambulatory Visit: Payer: 59 | Admitting: Occupational Therapy

## 2022-04-05 DIAGNOSIS — M205X1 Other deformities of toe(s) (acquired), right foot: Secondary | ICD-10-CM | POA: Diagnosis not present

## 2022-04-05 DIAGNOSIS — D2372 Other benign neoplasm of skin of left lower limb, including hip: Secondary | ICD-10-CM | POA: Diagnosis not present

## 2022-04-05 DIAGNOSIS — M205X2 Other deformities of toe(s) (acquired), left foot: Secondary | ICD-10-CM | POA: Diagnosis not present

## 2022-04-05 DIAGNOSIS — M792 Neuralgia and neuritis, unspecified: Secondary | ICD-10-CM | POA: Diagnosis not present

## 2022-04-05 DIAGNOSIS — D2371 Other benign neoplasm of skin of right lower limb, including hip: Secondary | ICD-10-CM | POA: Diagnosis not present

## 2022-04-05 DIAGNOSIS — Q828 Other specified congenital malformations of skin: Secondary | ICD-10-CM | POA: Diagnosis not present

## 2022-04-08 ENCOUNTER — Ambulatory Visit: Payer: 59 | Attending: Internal Medicine | Admitting: Occupational Therapy

## 2022-04-08 DIAGNOSIS — R279 Unspecified lack of coordination: Secondary | ICD-10-CM | POA: Insufficient documentation

## 2022-04-08 DIAGNOSIS — M6281 Muscle weakness (generalized): Secondary | ICD-10-CM | POA: Insufficient documentation

## 2022-04-23 ENCOUNTER — Ambulatory Visit: Payer: 59

## 2022-04-23 DIAGNOSIS — M6281 Muscle weakness (generalized): Secondary | ICD-10-CM | POA: Diagnosis not present

## 2022-04-23 DIAGNOSIS — R279 Unspecified lack of coordination: Secondary | ICD-10-CM

## 2022-04-23 NOTE — Therapy (Signed)
OUTPATIENT PHYSICAL THERAPY TREATMENT NOTE   Patient Name: Erika Schultz MRN: [id number] DOB:[birth year], 56 y.o., female Today's Date: 04/23/2022  PCP: None REFERRING PROVIDER: Ollen Bowl, MD  END OF SESSION:   PT End of Session - 04/23/22 1358     Visit Number 2    Date for PT Re-Evaluation 06/04/22    Authorization Type [employer]    PT Start Time 1400    PT Stop Time 1440    PT Time Calculation (min) 40 min    Activity Tolerance Patient tolerated treatment well    Behavior During Therapy [facility] for tasks assessed/performed             History reviewed. No pertinent past medical history. History reviewed. No pertinent surgical history. There are no problems to display for this patient.   REFERRING DIAG: N39.3 (ICD-10-CM) - Stress incontinence (female) (female)  THERAPY DIAG:  Muscle weakness (generalized)  Unspecified lack of coordination  Rationale for Evaluation and Treatment Rehabilitation  PERTINENT HISTORY: catrina fellenz - summer and fall of 2021  PRECAUTIONS: NA  SUBJECTIVE: Pt states that she has been working on pelvic floor strengthening. She has not been playing pickle ball and is unsure is symptoms are changing.  PAIN:  Are you having pain? No  SUBJECTIVE STATEMENT: Pt states that she has been having urinary leaking since having kids 20 years ago. She is active and plays pickle ball. Fluid intake: Yes: she drinks at least 64 oz a day; one cup of coffee in the morning ; she will drink sparkling waters and 2 glasses of wine a night   Patient confirms identification and approves PT to assess pelvic floor and treatment Yes       PATIENT GOALS to decrease leaking     BOWEL MOVEMENT Pain with bowel movement: No Type of bowel movement:Frequency 1x/day and Strain No Fully empty rectum: Yes: - Leakage: No Pads: No Fiber supplement: No   URINATION Pain with urination: No Fully empty bladder: Yes: - Stream: Strong Urgency: No - long time to  wait Frequency: 4-6x/day; 1x/night Leakage: Coughing, Sneezing, Exercise, and pickle ball Pads: Yes: panty liner only when she goes to play pickle ball   INTERCOURSE Pain with intercourse: Initial Penetration and During Penetration Ability to have vaginal penetration:  Yes: she states that she has some scar tissue on cervix that she uses vitamin E and coconut oil suppository for       PREGNANCY Vaginal deliveries 2 Tearing Yes: minimal C-section deliveries 0 Currently pregnant No     OBJECTIVE:    COGNITION:            Overall cognitive status: Within functional limits for tasks assessed                POSTURE:  Anterior pelvic tilt   PELVIC MMT: strength 2/5, endurance 2 seconds, repetitions 4 times with gluteal co-activation and poor coordination         PALPATION:   General  2 finger width diastasis recti with coning/doming, no tenderness in abdomen                 External Perineal Exam inability to bear down/bulge (paradoxical contraction)                             Internal Pelvic Floor mild redness in inferior introitis with tenderness/pinching   TONE: WNL   TODAY'S TREATMENT 04/23/22 Neuromuscular re-education: Core retraining:  Multimodal cues for transversus abdominus activation Core facilitation: Resisted supine march 2 x 10 Leg extensions 10x bil Self-care: Lubricants Vaginal moisturizers Vibrator/orgasm education Mindful masturbation   TREATMENT 03/12/22 EVAL  Manual: Soft tissue mobilization: Scar tissue mobilization: Myofascial release: Spinal mobilization: Internal pelvic floor techniques: Dry needling: Neuromuscular re-education: Core retraining:  Core facilitation: Form correction: Pelvic floor contraction training: Quick flicks 10x with breath coordination Long holds 5  x 10 seconds Down training: Exercises: Stretches/mobility: Strengthening: Therapeutic activities: Functional strengthening  activities: Self-care: Vulvovaignal massage Bladder irritants Pelvic floor anatomy and relationship between abdominals and pelvic floor       PATIENT EDUCATION:  Education details: See above self-care Person educated: Patient Education method: Explanation, Demonstration, Tactile cues, Verbal cues, and Handouts Education comprehension: verbalized understanding     HOME EXERCISE PROGRAM: [id number]   ASSESSMENT:   CLINICAL IMPRESSION: Patient overall doing very well working on initial HEP. Too early to tell if it is making a difference. We discussed good sexual health and how to increase libido. She was able to start core retraining with some difficulty finding transversus abdominus, but able to make good improvements with multimodal cues and breath coordination. She did well incorporating into core progressions. We discussed progression of  treatment in general. She will continue to benefit from skilled PT intervention in order to address impairments, reduce urinary incontinence with increased abdominal pressure, and improve QOL.      OBJECTIVE IMPAIRMENTS decreased activity tolerance, decreased coordination, decreased endurance, decreased strength, increased muscle spasms, postural dysfunction, and pain.    ACTIVITY LIMITATIONS  exercise; intercourse .    PERSONAL FACTORS 1 comorbidity: 2 vaginal deliveries  are also affecting patient's functional outcome.      REHAB POTENTIAL: Good   CLINICAL DECISION MAKING: Stable/uncomplicated   EVALUATION COMPLEXITY: Low     GOALS: Goals reviewed with patient? Yes   SHORT TERM GOALS: Target date: 04/09/2022   Pt will be independent with HEP.    Baseline: Goal status: INITIAL   2.  Pt will be able to correctly perform diaphragmatic breathing and appropriate pressure management in order to prevent worsening vaginal wall laxity and improve pelvic floor A/ROM.    Baseline:  Goal status: INITIAL   3.  Pt will be independent with the  knack decrease urinary incontinence.    Baseline:  Goal status: INITIAL   LONG TERM GOALS: Target date: 06/04/2022   Pt will be independent with advanced HEP.    Baseline:  Goal status: INITIAL   2.  Pt will report no leaks with pickle ball, exercise, laughing, coughing, sneezing in order to improve comfort with interpersonal relationships and community activities.    Baseline:  Goal status: INITIAL   3.  Pt will demonstrate normal pelvic floor muscle tone and A/ROM, able to achieve 4/5 strength with contractions and 10 sec endurance, in order to provide appropriate lumbopelvic support in functional activities.    Baseline:  Goal status: INITIAL   4.  Pt will report 0/10 pain with vaginal penetration in order to improve intimate relationship with partner.     Baseline:  Goal status: INITIAL     PLAN: PT FREQUENCY: 1x/week   PT DURATION: 12 weeks   PLANNED INTERVENTIONS: Therapeutic exercises, Therapeutic activity, Neuromuscular re-education, Balance training, Gait training, Patient/Family education, Joint mobilization, Dry Needling, Biofeedback, and Manual therapy   PLAN FOR NEXT SESSION: Progress core and pelvic floor into functional strengthening with good pressure management/breath coordination.    Julio Alm, PT, DPT05/23/233:15 PM

## 2022-05-14 ENCOUNTER — Ambulatory Visit: Payer: 59 | Attending: Internal Medicine

## 2022-05-14 DIAGNOSIS — R279 Unspecified lack of coordination: Secondary | ICD-10-CM | POA: Diagnosis not present

## 2022-05-14 DIAGNOSIS — M6281 Muscle weakness (generalized): Secondary | ICD-10-CM | POA: Diagnosis not present

## 2022-05-14 NOTE — Therapy (Signed)
OUTPATIENT PHYSICAL THERAPY TREATMENT NOTE   Patient Name: Erika Schultz MRN: [id number] DOB:1966-08-14, 56 y.o., female Today's Date: 05/14/2022  PCP: None REFERRING PROVIDER: Mckinley Jewel, MD  END OF SESSION:   PT End of Session - 05/14/22 1152     Visit Number 3    Date for PT Re-Evaluation 06/04/22    Authorization Type [employer]    PT Start Time 1152    PT Stop Time 1231    PT Time Calculation (min) 39 min    Activity Tolerance Patient tolerated treatment well    Behavior During Therapy [facility] for tasks assessed/performed              History reviewed. No pertinent past medical history. History reviewed. No pertinent surgical history. There are no problems to display for this patient.   REFERRING DIAG: N39.3 (ICD-10-CM) - Stress incontinence (female) (female)  THERAPY DIAG:  Muscle weakness (generalized)  Unspecified lack of coordination  Rationale for Evaluation and Treatment Rehabilitation  PERTINENT HISTORY: aury scollard - summer and fall of 2021  PRECAUTIONS: NA  SUBJECTIVE: Patient reports some improvements when she is playing pickle ball, but she has not been great about performing HEP regularly. She states that she is having trouble getting to all of her exercises between 2 different types of PT and OT.   PAIN:  Are you having pain? No  SUBJECTIVE STATEMENT: Pt states that she has been having urinary leaking since having kids 20 years ago. She is active and plays pickle ball. Fluid intake: Yes: she drinks at least 64 oz a day; one cup of coffee in the morning ; she will drink sparkling waters and 2 glasses of wine a night   Patient confirms identification and approves PT to assess pelvic floor and treatment Yes       PATIENT GOALS to decrease leaking     BOWEL MOVEMENT Pain with bowel movement: No Type of bowel movement:Frequency 1x/day and Strain No Fully empty rectum: Yes: - Leakage: No Pads: No Fiber supplement: No   URINATION Pain with  urination: No Fully empty bladder: Yes: - Stream: Strong Urgency: No - long time to wait Frequency: 4-6x/day; 1x/night Leakage: Coughing, Sneezing, Exercise, and pickle ball Pads: Yes: panty liner only when she goes to play pickle ball   INTERCOURSE Pain with intercourse: Initial Penetration and During Penetration Ability to have vaginal penetration:  Yes: she states that she has some scar tissue on cervix that she uses vitamin E and coconut oil suppository for       PREGNANCY Vaginal deliveries 2 Tearing Yes: minimal C-section deliveries 0 Currently pregnant No     OBJECTIVE:    COGNITION:            Overall cognitive status: Within functional limits for tasks assessed                POSTURE:  Anterior pelvic tilt   PELVIC MMT: strength 2/5, endurance 2 seconds, repetitions 4 times with gluteal co-activation and poor coordination         PALPATION:   General  2 finger width diastasis recti with coning/doming, no tenderness in abdomen                 External Perineal Exam inability to bear down/bulge (paradoxical contraction)                             Internal Pelvic Floor mild redness  in inferior introitis with tenderness/pinching   TONE: WNL   TODAY'S TREATMENT 05/14/22 Neuromuscular re-education: Core retraining:  Core facilitation: Unilateral row with rotation 10x bil, 10lbs Pallof press with rotation, 5 lbs, 10x bil Exercises: Stretches/mobility: Candle stick dippers 10x bil Strengthening: Modified side plank/crunch 10x bil Self-care: Education on anatomy, how to incorporate exercises into daily routine, incorporating pelvic floor/core into all exercises she is already doing    TREATMENT 04/23/22 Neuromuscular re-education: Core retraining:  Multimodal cues for transversus abdominus activation Core facilitation: Resisted supine march 2 x 10 Leg extensions 10x bil Self-care: Lubricants Vaginal moisturizers Vibrator/orgasm education Mindful  masturbation   TREATMENT 03/12/22 EVAL  Manual: Soft tissue mobilization: Scar tissue mobilization: Myofascial release: Spinal mobilization: Internal pelvic floor techniques: Dry needling: Neuromuscular re-education: Core retraining:  Core facilitation: Form correction: Pelvic floor contraction training: Quick flicks 83M with breath coordination Long holds 5  x 10 seconds Down training: Exercises: Stretches/mobility: Strengthening: Therapeutic activities: Functional strengthening activities: Self-care: Vulvovaignal massage Bladder irritants Pelvic floor anatomy and relationship between abdominals and pelvic floor       PATIENT EDUCATION:  Education details: See above self-care Person educated: Patient Education method: Explanation, Demonstration, Tactile cues, Verbal cues, and Handouts Education comprehension: verbalized understanding     HOME EXERCISE PROGRAM: [id number]   ASSESSMENT:   CLINICAL IMPRESSION: Patient is doing well and seeing some improvements with leaking during pickleball. We discussed that no activities are off limits right now unless she is symptomatic; she was encouraged to bring in other exercises and we can see how to incorporate pelvic floor/core strengthening into these to make Hep less overwhelming. She did very well incorporating core/pelvic floor into complex movements. She does demonstrate notable Bil hip tightness that should be addressed throughout exercise progressions in order to make sure pelvic floor is getting appropriate circulation and mobility. She will continue to benefit from skilled PT intervention in order to address impairments, reduce urinary incontinence with increased abdominal pressure, and improve QOL.      OBJECTIVE IMPAIRMENTS decreased activity tolerance, decreased coordination, decreased endurance, decreased strength, increased muscle spasms, postural dysfunction, and pain.    ACTIVITY LIMITATIONS  exercise;  intercourse .    PERSONAL FACTORS 1 comorbidity: 2 vaginal deliveries  are also affecting patient's functional outcome.      REHAB POTENTIAL: Good   CLINICAL DECISION MAKING: Stable/uncomplicated   EVALUATION COMPLEXITY: Low     GOALS: Goals reviewed with patient? Yes   SHORT TERM GOALS: Target date: 04/09/2022 - updated 05/14/22   Pt will be independent with HEP.    Baseline: Goal status: MET   2.  Pt will be able to correctly perform diaphragmatic breathing and appropriate pressure management in order to prevent worsening vaginal wall laxity and improve pelvic floor A/ROM.    Baseline:  Goal status: MET   3.  Pt will be independent with the knack decrease urinary incontinence.    Baseline:  Goal status: IN PROGRESS   LONG TERM GOALS: Target date: 06/04/2022 - updated 05/14/22   Pt will be independent with advanced HEP.    Baseline:  Goal status: IN PROGRESS   2.  Pt will report no leaks with pickle ball, exercise, laughing, coughing, sneezing in order to improve comfort with interpersonal relationships and community activities.    Baseline:  Goal status: IN PROGRESS   3.  Pt will demonstrate normal pelvic floor muscle tone and A/ROM, able to achieve 4/5 strength with contractions and 10 sec endurance, in order to provide appropriate  lumbopelvic support in functional activities.    Baseline:  Goal status: IN PROGRESS   4.  Pt will report 0/10 pain with vaginal penetration in order to improve intimate relationship with partner.     Baseline:  Goal status: IN PROGRESS     PLAN: PT FREQUENCY: 1x/week   PT DURATION: 12 weeks   PLANNED INTERVENTIONS: Therapeutic exercises, Therapeutic activity, Neuromuscular re-education, Balance training, Gait training, Patient/Family education, Joint mobilization, Dry Needling, Biofeedback, and Manual therapy   PLAN FOR NEXT SESSION: Progress core and pelvic floor strengthening with functional activities; plyometrics and lateral  movement.   Heather Roberts, PT, DPT06/13/232:09 PM

## 2022-05-22 ENCOUNTER — Ambulatory Visit: Payer: 59

## 2022-05-27 ENCOUNTER — Ambulatory Visit: Payer: 59

## 2022-06-12 ENCOUNTER — Ambulatory Visit: Payer: 59 | Attending: Internal Medicine

## 2022-06-12 DIAGNOSIS — M6281 Muscle weakness (generalized): Secondary | ICD-10-CM | POA: Insufficient documentation

## 2022-06-12 DIAGNOSIS — R279 Unspecified lack of coordination: Secondary | ICD-10-CM | POA: Diagnosis not present

## 2022-06-12 NOTE — Therapy (Signed)
OUTPATIENT PHYSICAL THERAPY TREATMENT NOTE   Patient Name: Erika Schultz MRN: [id number] DOB:02/21/1966, 56 y.o., female Today's Date: 06/12/2022  PCP: None REFERRING PROVIDER: Mckinley Jewel, MD  END OF SESSION:   PT End of Session - 06/12/22 1105     Visit Number 4    Date for PT Re-Evaluation 08/07/22    Authorization Type [employer]et[employer]    PT Start Time 1105    PT Stop Time 1145    PT Time Calculation (min) 40 min    Activity Tolerance Patient tolerated treatment well    Behavior During Therapy [facility] for tasks assessed/performed               History reviewed. No pertinent past medical history. History reviewed. No pertinent surgical history. There are no problems to display for this patient.   REFERRING DIAG: N39.3 (ICD-10-CM) - Stress incontinence (female) (female)  THERAPY DIAG:  Muscle weakness (generalized)  Unspecified lack of coordination  Rationale for Evaluation and Treatment Rehabilitation  PERTINENT HISTORY: josephene marrone - summer and fall of 2021  PRECAUTIONS: NA  SUBJECTIVE: Patient states that when she does the knack she does not have any leaking with coughing or sneezing. She has been working on pelvic floor contractions regularly. She did play pickle ball after last session and felt like she had large reduction in leaking during the activity. She is still feeling overwhelmed in how to incorporate all the appropriate exercises she needs for various parts of her body.   PAIN:  Are you having pain? No  SUBJECTIVE STATEMENT 03/12/22: Pt states that she has been having urinary leaking since having kids 20 years ago. She is active and plays pickle ball. Fluid intake: Yes: she drinks at least 64 oz a day; one cup of coffee in the morning ; she will drink sparkling waters and 2 glasses of wine a night   Patient confirms identification and approves PT to assess pelvic floor and treatment Yes       PATIENT GOALS to decrease leaking     BOWEL MOVEMENT Pain  with bowel movement: No Type of bowel movement:Frequency 1x/day and Strain No Fully empty rectum: Yes: - Leakage: No Pads: No Fiber supplement: No   URINATION Pain with urination: No Fully empty bladder: Yes: - Stream: Strong Urgency: No - long time to wait Frequency: 4-6x/day; 1x/night Leakage: Coughing, Sneezing, Exercise, and pickle ball Pads: Yes: panty liner only when she goes to play pickle ball   INTERCOURSE Pain with intercourse: Initial Penetration and During Penetration Ability to have vaginal penetration:  Yes: she states that she has some scar tissue on cervix that she uses vitamin E and coconut oil suppository for       PREGNANCY Vaginal deliveries 2 Tearing Yes: minimal C-section deliveries 0 Currently pregnant No     OBJECTIVE 06/12/22: no formal pelvic floor assessment today; core weakness and decreased proprioception with inability to hold back flat on table with leg extensions without extensive cueing  03/12/22:    COGNITION:            Overall cognitive status: Within functional limits for tasks assessed                POSTURE:  Anterior pelvic tilt   PELVIC MMT: strength 2/5, endurance 2 seconds, repetitions 4 times with gluteal co-activation and poor coordination         PALPATION:   General  2 finger width diastasis recti with coning/doming, no tenderness in abdomen  External Perineal Exam inability to bear down/bulge (paradoxical contraction)                             Internal Pelvic Floor mild redness in inferior introitis with tenderness/pinching   TONE: WNL   TODAY'S TREATMENT 06/12/22 Neuromuscular re-education: Core facilitation: Leg extensions 10x bil Supine leg lowers 10x bil Exercises: Stretches/mobility: Strengthening: Bridge with adduction squeeze 10x Squats 10x Self-care: Reviewed neck exercises and discussed how to incorporate core/pelvic floor into General rule to exhale with effort during  exercise  TREATMENT 05/14/22 Neuromuscular re-education: Core retraining:  Core facilitation: Unilateral row with rotation 10x bil, 10lbs Pallof press with rotation, 5 lbs, 10x bil Exercises: Stretches/mobility: Candle stick dippers 10x bil Strengthening: Modified side plank/crunch 10x bil Self-care: Education on anatomy, how to incorporate exercises into daily routine, incorporating pelvic floor/core into all exercises she is already doing    TREATMENT 04/23/22 Neuromuscular re-education: Core retraining:  Multimodal cues for transversus abdominus activation Core facilitation: Resisted supine march 2 x 10 Leg extensions 10x bil Self-care: Lubricants Vaginal moisturizers Vibrator/orgasm education Mindful masturbation      PATIENT EDUCATION:  Education details: See above self-care Person educated: Patient Education method: Explanation, Demonstration, Tactile cues, Verbal cues, and Handouts Education comprehension: verbalized understanding     HOME EXERCISE PROGRAM: [id number]   ASSESSMENT:   CLINICAL IMPRESSION: Pt is overall beginning to see progress demonstrated by less stress urinary incontinence. Time spent today of incorporating core and pelvic floor contraction/awareness into other exercises so patient does not feel like she has to do several sets of PT exercises and the exercises that she enjoys. We discussed that there are no bad exercises, she just needs to increase awareness of posture/control of low back/pelvis during exercises to determine if it is appropriate for her at that time. She did very well working through these concepts with exercise and incorporating the idea of exhaling with bigger effort. She reported feeling more confident moving forward in her work outs. She will continue to benefit from skilled PT intervention in order to address impairments, reduce urinary incontinence with increased abdominal pressure, and improve QOL.      OBJECTIVE  IMPAIRMENTS decreased activity tolerance, decreased coordination, decreased endurance, decreased strength, increased muscle spasms, postural dysfunction, and pain.    ACTIVITY LIMITATIONS  exercise; intercourse .    PERSONAL FACTORS 1 comorbidity: 2 vaginal deliveries  are also affecting patient's functional outcome.      REHAB POTENTIAL: Good   CLINICAL DECISION MAKING: Stable/uncomplicated   EVALUATION COMPLEXITY: Low     GOALS: Goals reviewed with patient? Yes   SHORT TERM GOALS: Target date: 04/09/2022 - updated 06/12/22   Pt will be independent with HEP.    Baseline: Goal status: MET 06/12/22   2.  Pt will be able to correctly perform diaphragmatic breathing and appropriate pressure management in order to prevent worsening vaginal wall laxity and improve pelvic floor A/ROM.    Baseline:  Goal status: MET 06/12/22   3.  Pt will be independent with the knack decrease urinary incontinence.    Baseline:  Goal status: MET 06/12/22   LONG TERM GOALS: Target date: 06/04/2022 - updated 06/12/22   Pt will be independent with advanced HEP.    Baseline:  Goal status: IN PROGRESS   2.  Pt will report no leaks with pickle ball, exercise, laughing, coughing, sneezing in order to improve comfort with interpersonal relationships and community activities.  Baseline: Improving 06/12/22 Goal status: IN PROGRESS   3.  Pt will demonstrate normal pelvic floor muscle tone and A/ROM, able to achieve 4/5 strength with contractions and 10 sec endurance, in order to provide appropriate lumbopelvic support in functional activities.    Baseline: not formally assessed today Goal status: IN PROGRESS   4.  Pt will report 0/10 pain with vaginal penetration in order to improve intimate relationship with partner.     Baseline: not assessed today Goal status: IN PROGRESS     PLAN: PT FREQUENCY: 1x/week   PT DURATION: 8 weeks   PLANNED INTERVENTIONS: Therapeutic exercises, Therapeutic activity,  Neuromuscular re-education, Balance training, Gait training, Patient/Family education, Joint mobilization, Dry Needling, Biofeedback, and Manual therapy   PLAN FOR NEXT SESSION: Progress core and pelvic floor strengthening with functional activities; plyometrics and lateral movement.   Heather Roberts, PT, DPT07/12/231:39 PM

## 2022-06-19 ENCOUNTER — Ambulatory Visit: Payer: 59

## 2022-06-19 DIAGNOSIS — R279 Unspecified lack of coordination: Secondary | ICD-10-CM | POA: Diagnosis not present

## 2022-06-19 DIAGNOSIS — M6281 Muscle weakness (generalized): Secondary | ICD-10-CM

## 2022-06-19 NOTE — Therapy (Signed)
OUTPATIENT PHYSICAL THERAPY TREATMENT NOTE   Patient Name: Erika Schultz MRN: [id number] DOB:[birth year], 56 y.o., female Today's Date: 06/19/2022  PCP: None REFERRING PROVIDER: Mckinley Jewel, MD  END OF SESSION:   PT End of Session - 06/19/22 1453     Visit Number 5    Date for PT Re-Evaluation 08/07/22    Authorization Type [employer]    PT Start Time 1450    PT Stop Time 1530    PT Time Calculation (min) 40 min    Activity Tolerance Patient tolerated treatment well    Behavior During Therapy [facility] for tasks assessed/performed                History reviewed. No pertinent past medical history. History reviewed. No pertinent surgical history. There are no problems to display for this patient.   REFERRING DIAG: N39.3 (ICD-10-CM) - Stress incontinence (female) (female)  THERAPY DIAG:  Muscle weakness (generalized)  Unspecified lack of coordination  Rationale for Evaluation and Treatment Rehabilitation  PERTINENT HISTORY: Erika Schultz - summer and fall of 2021  PRECAUTIONS: NA  SUBJECTIVE: Patient states that when she does the knack she does not have any leaking with coughing or sneezing. She has been working on pelvic floor contractions regularly. She did play pickle ball after last session and felt like she had large reduction in leaking during the activity. She is still feeling overwhelmed in how to incorporate all the appropriate exercises she needs for various parts of her body.   PAIN:  Are you having pain? No  SUBJECTIVE STATEMENT 03/12/22: Pt states that she has been having urinary leaking since having kids 20 years ago. She is active and plays pickle ball. Fluid intake: Yes: she drinks at least 64 oz a day; one cup of coffee in the morning ; she will drink sparkling waters and 2 glasses of wine a night   Patient confirms identification and approves PT to assess pelvic floor and treatment Yes       PATIENT GOALS to decrease leaking     BOWEL MOVEMENT Pain  with bowel movement: No Type of bowel movement:Frequency 1x/day and Strain No Fully empty rectum: Yes: - Leakage: No Pads: No Fiber supplement: No   URINATION Pain with urination: No Fully empty bladder: Yes: - Stream: Strong Urgency: No - long time to wait Frequency: 4-6x/day; 1x/night Leakage: Coughing, Sneezing, Exercise, and pickle ball Pads: Yes: panty liner only when she goes to play pickle ball   INTERCOURSE Pain with intercourse: Initial Penetration and During Penetration Ability to have vaginal penetration:  Yes: she states that she has some scar tissue on cervix that she uses vitamin E and coconut oil suppository for       PREGNANCY Vaginal deliveries 2 Tearing Yes: minimal C-section deliveries 0 Currently pregnant No     OBJECTIVE 06/12/22: no formal pelvic floor assessment today; core weakness and decreased proprioception with inability to hold back flat on table with leg extensions without extensive cueing  03/12/22:    COGNITION:            Overall cognitive status: Within functional limits for tasks assessed                POSTURE:  Anterior pelvic tilt   PELVIC MMT: strength 2/5, endurance 2 seconds, repetitions 4 times with gluteal co-activation and poor coordination         PALPATION:   General  2 finger width diastasis recti with coning/doming, no tenderness in abdomen  External Perineal Exam inability to bear down/bulge (paradoxical contraction)                             Internal Pelvic Floor mild redness in inferior introitis with tenderness/pinching   TONE: WNL   TODAY'S TREATMENT 06/19/22 Neuromuscular re-education: Core facilitation: Trampoline bouncing and weight shifting with pelvic floor/core facilitation Exercises: Strengthening: Running man 20x bil Jump squat 2 x 10 Lateral shuffle 10x bil with floor touch Heel slams 2 x 10   TREATMENT 06/12/22 Neuromuscular re-education: Core facilitation: Leg extensions 10x  bil Supine leg lowers 10x bil Exercises: Stretches/mobility: Strengthening: Bridge with adduction squeeze 10x Squats 10x Self-care: Reviewed neck exercises and discussed how to incorporate core/pelvic floor into General rule to exhale with effort during exercise  TREATMENT 05/14/22 Neuromuscular re-education: Core retraining:  Core facilitation: Unilateral row with rotation 10x bil, 10lbs Pallof press with rotation, 5 lbs, 10x bil Exercises: Stretches/mobility: Candle stick dippers 10x bil Strengthening: Modified side plank/crunch 10x bil Self-care: Education on anatomy, how to incorporate exercises into daily routine, incorporating pelvic floor/core into all exercises she is already doing      PATIENT EDUCATION:  Education details: See above self-care Person educated: Patient Education method: Explanation, Demonstration, Tactile cues, Verbal cues, and Handouts Education comprehension: verbalized understanding     HOME EXERCISE PROGRAM: [id number]   ASSESSMENT:   CLINICAL IMPRESSION: Pt has not had any episodes of urinary incontinence over the last week, during work outs or pickle ball last night. She has been working very hard at incorporating the concepts of exhaling with effort and core/pelvic floor facilitation in exercise and daily activities. She did very well with all lateral movement and plyometric training today with good awareness of core activation. She did not have any symptoms during exercise today. She will continue to benefit from skilled PT intervention in order to address impairments, reduce urinary incontinence with increased abdominal pressure, and improve QOL.      OBJECTIVE IMPAIRMENTS decreased activity tolerance, decreased coordination, decreased endurance, decreased strength, increased muscle spasms, postural dysfunction, and pain.    ACTIVITY LIMITATIONS  exercise; intercourse .    PERSONAL FACTORS 1 comorbidity: 2 vaginal deliveries  are also  affecting patient's functional outcome.      REHAB POTENTIAL: Good   CLINICAL DECISION MAKING: Stable/uncomplicated   EVALUATION COMPLEXITY: Low     GOALS: Goals reviewed with patient? Yes   SHORT TERM GOALS: Target date: 04/09/2022 - updated 06/12/22   Pt will be independent with HEP.    Baseline: Goal status: MET 06/12/22   2.  Pt will be able to correctly perform diaphragmatic breathing and appropriate pressure management in order to prevent worsening vaginal wall laxity and improve pelvic floor A/ROM.    Baseline:  Goal status: MET 06/12/22   3.  Pt will be independent with the knack decrease urinary incontinence.    Baseline:  Goal status: MET 06/12/22   LONG TERM GOALS: Target date: 06/04/2022 - updated 06/12/22   Pt will be independent with advanced HEP.    Baseline:  Goal status: IN PROGRESS   2.  Pt will report no leaks with pickle ball, exercise, laughing, coughing, sneezing in order to improve comfort with interpersonal relationships and community activities.    Baseline: Improving 06/12/22 Goal status: IN PROGRESS   3.  Pt will demonstrate normal pelvic floor muscle tone and A/ROM, able to achieve 4/5 strength with contractions and 10 sec endurance, in order  to provide appropriate lumbopelvic support in functional activities.    Baseline: not formally assessed today Goal status: IN PROGRESS   4.  Pt will report 0/10 pain with vaginal penetration in order to improve intimate relationship with partner.     Baseline: not assessed today Goal status: IN PROGRESS     PLAN: PT FREQUENCY: 1x/week   PT DURATION: 8 weeks   PLANNED INTERVENTIONS: Therapeutic exercises, Therapeutic activity, Neuromuscular re-education, Balance training, Gait training, Patient/Family education, Joint mobilization, Dry Needling, Biofeedback, and Manual therapy   PLAN FOR NEXT SESSION: Progress core and pelvic floor strengthening with functional activities; dry needling. Possible D/C.     Heather Roberts, PT, DPT07/19/233:31 PM

## 2022-06-27 ENCOUNTER — Ambulatory Visit: Payer: 59

## 2022-06-27 DIAGNOSIS — M6281 Muscle weakness (generalized): Secondary | ICD-10-CM

## 2022-06-27 DIAGNOSIS — R279 Unspecified lack of coordination: Secondary | ICD-10-CM

## 2022-06-27 NOTE — Therapy (Signed)
OUTPATIENT PHYSICAL THERAPY TREATMENT NOTE   Patient Name: Erika Schultz MRN: [id number] DOB:03/22/1966, 56 y.o., female Today's Date: 06/27/2022  PCP: None REFERRING PROVIDER: Mckinley Jewel, MD  END OF SESSION:   PT End of Session - 06/27/22 1024     Visit Number 6    Date for PT Re-Evaluation 08/07/22    Authorization Type [employer]    PT Start Time 1023    PT Stop Time 1100    PT Time Calculation (min) 37 min    Activity Tolerance Patient tolerated treatment well    Behavior During Therapy [facility] for tasks assessed/performed                 History reviewed. No pertinent past medical history. History reviewed. No pertinent surgical history. There are no problems to display for this patient.   REFERRING DIAG: N39.3 (ICD-10-CM) - Stress incontinence (female) (female)  THERAPY DIAG:  Muscle weakness (generalized)  Unspecified lack of coordination  Rationale for Evaluation and Treatment Rehabilitation  PERTINENT HISTORY: evanie buckle - summer and fall of 2021  PRECAUTIONS: NA  SUBJECTIVE: Patient states that she is feeling better and not noticing leaking during pickle ball with heavy impact. She has been continuing perineal massage and states that it is very helpful.   PAIN:  Are you having pain? No  SUBJECTIVE STATEMENT 03/12/22: Pt states that she has been having urinary leaking since having kids 20 years ago. She is active and plays pickle ball. Fluid intake: Yes: she drinks at least 64 oz a day; one cup of coffee in the morning ; she will drink sparkling waters and 2 glasses of wine a night   Patient confirms identification and approves PT to assess pelvic floor and treatment Yes       PATIENT GOALS to decrease leaking     BOWEL MOVEMENT Pain with bowel movement: No Type of bowel movement:Frequency 1x/day and Strain No Fully empty rectum: Yes: - Leakage: No Pads: No Fiber supplement: No   URINATION Pain with urination: No Fully empty bladder: Yes:  - Stream: Strong Urgency: No - long time to wait Frequency: 4-6x/day; 1x/night Leakage: Coughing, Sneezing, Exercise, and pickle ball Pads: Yes: panty liner only when she goes to play pickle ball   INTERCOURSE Pain with intercourse: Initial Penetration and During Penetration Ability to have vaginal penetration:  Yes: she states that she has some scar tissue on cervix that she uses vitamin E and coconut oil suppository for       PREGNANCY Vaginal deliveries 2 Tearing Yes: minimal C-section deliveries 0 Currently pregnant No     OBJECTIVE 06/12/22: no formal pelvic floor assessment today; core weakness and decreased proprioception with inability to hold back flat on table with leg extensions without extensive cueing  03/12/22:    COGNITION:            Overall cognitive status: Within functional limits for tasks assessed                POSTURE:  Anterior pelvic tilt   PELVIC MMT: strength 2/5, endurance 2 seconds, repetitions 4 times with gluteal co-activation and poor coordination         PALPATION:   General  2 finger width diastasis recti with coning/doming, no tenderness in abdomen                 External Perineal Exam inability to bear down/bulge (paradoxical contraction)  Internal Pelvic Floor mild redness in inferior introitis with tenderness/pinching   TONE: WNL   TODAY'S TREATMENT 06/27/22 Manual: Soft tissue mobilization: Rt UT Dry needling: Trigger Point Dry-Needling  Treatment instructions: Expect mild to moderate muscle soreness. S/S of pneumothorax if dry needled over a lung field, and to seek immediate medical attention should they occur. Patient verbalized understanding of these instructions and education.  Patient Consent Given: Yes Education handout provided: Yes Muscles treated: Rt UT Electrical stimulation performed: No Parameters: N/A Treatment response/outcome: significant twitch response and muscular release of  trigger points   Neuromuscular re-education: Core facilitation: Supine leg extensions 10x bil Supine elevated heel taps 10x bil Self-care: Avoiding soap to vulva Contacting MD about topical external use of estrogen cream HEP - incorporating concepts we have discussed into new exercises and modifying when necessary   TREATMENT 06/19/22 Neuromuscular re-education: Core facilitation: Trampoline bouncing and weight shifting with pelvic floor/core facilitation Exercises: Strengthening: Running man 20x bil Jump squat 2 x 10 Lateral shuffle 10x bil with floor touch Heel slams 2 x 10   TREATMENT 06/12/22 Neuromuscular re-education: Core facilitation: Leg extensions 10x bil Supine leg lowers 10x bil Exercises: Stretches/mobility: Strengthening: Bridge with adduction squeeze 10x Squats 10x Self-care: Reviewed neck exercises and discussed how to incorporate core/pelvic floor into General rule to exhale with effort during exercise      PATIENT EDUCATION:  Education details: See above self-care Person educated: Patient Education method: Explanation, Demonstration, Tactile cues, Verbal cues, and Handouts Education comprehension: verbalized understanding     HOME EXERCISE PROGRAM: [id number]   ASSESSMENT:   CLINICAL IMPRESSION: Pt has made great progress in pelvic floor physical therapy with increasing her ability and awareness of pelvic floor and core muscles to help decrease leaking during high level activities like pickle ball. She is still having some incontinence with very strong sneezes, but feels like it is getting much better. She is not having as much discomfort with intercourse, but there is still some introital pain. We discussed talking with MD about external/topical use of Premarin cream that has already been prescribed, but not currently using. We reviewed appropriate core activation during several exercises to ensure she understands these concepts. Due to progress and  having the tools to continue making progress towards goal completion, she is prepared to D/C at this time; she was encouraged to call with any questions/concerns.      OBJECTIVE IMPAIRMENTS decreased activity tolerance, decreased coordination, decreased endurance, decreased strength, increased muscle spasms, postural dysfunction, and pain.    ACTIVITY LIMITATIONS  exercise; intercourse .    PERSONAL FACTORS 1 comorbidity: 2 vaginal deliveries  are also affecting patient's functional outcome.      REHAB POTENTIAL: Good   CLINICAL DECISION MAKING: Stable/uncomplicated   EVALUATION COMPLEXITY: Low     GOALS: Goals reviewed with patient? Yes   SHORT TERM GOALS: Target date: 04/09/2022 - updated 06/12/22   Pt will be independent with HEP.    Baseline: Goal status: MET 06/12/22   2.  Pt will be able to correctly perform diaphragmatic breathing and appropriate pressure management in order to prevent worsening vaginal wall laxity and improve pelvic floor A/ROM.    Baseline:  Goal status: MET 06/12/22   3.  Pt will be independent with the knack decrease urinary incontinence.    Baseline:  Goal status: MET 06/12/22   LONG TERM GOALS: Target date: 06/04/2022 - updated 06/27/22   Pt will be independent with advanced HEP.    Baseline:  Goal status:  MET 06/27/22   2.  Pt will report no leaks with pickle ball, exercise, laughing, coughing, sneezing in order to improve comfort with interpersonal relationships and community activities.    Baseline: Improving, having small leaks with very big sneezes Goal status: DISCHARGED 06/27/22   3.  Pt will demonstrate normal pelvic floor muscle tone and A/ROM, able to achieve 4/5 strength with contractions and 10 sec endurance, in order to provide appropriate lumbopelvic support in functional activities.    Baseline: not formally assessed today, but due to progress can assume she is making good strength gains Goal status: DISCHARGED 06/27/22   4.  Pt  will report 0/10 pain with vaginal penetration in order to improve intimate relationship with partner.     Baseline: pt reporting improved comfort with intercourse Goal status: DISCHARGED 06/27/22     PLAN: PT FREQUENCY: 1x/week   PT DURATION: 8 weeks   PLANNED INTERVENTIONS: Therapeutic exercises, Therapeutic activity, Neuromuscular re-education, Balance training, Gait training, Patient/Family education, Joint mobilization, Dry Needling, Biofeedback, and Manual therapy   PLAN FOR NEXT SESSION: Progress core and pelvic floor strengthening with functional activities; dry needling. Possible D/C.   PHYSICAL THERAPY DISCHARGE SUMMARY  Visits from Start of Care: 6  Current functional level related to goals / functional outcomes: Pt has made great progress towards goals   Remaining deficits: See above   Education / Equipment: HEP   Patient agrees to discharge. Patient goals were partially met. Patient is being discharged due to  making great progress towards goals and having tools to continue making progress.  Heather Roberts, PT, DPT07/27/232:10 PM

## 2022-09-10 DIAGNOSIS — Z789 Other specified health status: Secondary | ICD-10-CM | POA: Diagnosis not present

## 2022-09-10 DIAGNOSIS — N951 Menopausal and female climacteric states: Secondary | ICD-10-CM | POA: Diagnosis not present

## 2022-09-10 DIAGNOSIS — Z13220 Encounter for screening for lipoid disorders: Secondary | ICD-10-CM | POA: Diagnosis not present

## 2022-10-17 DIAGNOSIS — J0100 Acute maxillary sinusitis, unspecified: Secondary | ICD-10-CM | POA: Diagnosis not present

## 2022-10-31 DIAGNOSIS — Z6824 Body mass index (BMI) 24.0-24.9, adult: Secondary | ICD-10-CM | POA: Diagnosis not present

## 2022-10-31 DIAGNOSIS — Z01419 Encounter for gynecological examination (general) (routine) without abnormal findings: Secondary | ICD-10-CM | POA: Diagnosis not present

## 2022-10-31 DIAGNOSIS — Z1211 Encounter for screening for malignant neoplasm of colon: Secondary | ICD-10-CM | POA: Diagnosis not present

## 2022-10-31 DIAGNOSIS — Z760 Encounter for issue of repeat prescription: Secondary | ICD-10-CM | POA: Diagnosis not present

## 2022-10-31 DIAGNOSIS — Z83719 Family history of colon polyps, unspecified: Secondary | ICD-10-CM | POA: Diagnosis not present

## 2022-10-31 DIAGNOSIS — Z1231 Encounter for screening mammogram for malignant neoplasm of breast: Secondary | ICD-10-CM | POA: Diagnosis not present

## 2022-10-31 DIAGNOSIS — Z124 Encounter for screening for malignant neoplasm of cervix: Secondary | ICD-10-CM | POA: Diagnosis not present

## 2022-11-04 ENCOUNTER — Other Ambulatory Visit: Payer: Self-pay | Admitting: Obstetrics and Gynecology

## 2022-11-04 DIAGNOSIS — R928 Other abnormal and inconclusive findings on diagnostic imaging of breast: Secondary | ICD-10-CM

## 2022-11-18 ENCOUNTER — Ambulatory Visit
Admission: RE | Admit: 2022-11-18 | Discharge: 2022-11-18 | Disposition: A | Discharge status: Home / Self Care | Payer: 59 | Source: Ambulatory Visit | Attending: Obstetrics and Gynecology | Admitting: Obstetrics and Gynecology

## 2022-11-18 ENCOUNTER — Ambulatory Visit: Payer: 59

## 2022-11-18 DIAGNOSIS — R928 Other abnormal and inconclusive findings on diagnostic imaging of breast: Secondary | ICD-10-CM

## 2022-11-18 DIAGNOSIS — R922 Inconclusive mammogram: Secondary | ICD-10-CM | POA: Diagnosis not present

## 2022-12-18 DIAGNOSIS — J0181 Other acute recurrent sinusitis: Secondary | ICD-10-CM | POA: Diagnosis not present

## 2023-07-18 IMAGING — MG MM DIGITAL DIAGNOSTIC UNILAT*L* W/ TOMO W/ CAD
8 series · 8 of 24 positions shown · non-contrast
Comparison: Previous exam(s).

CLINICAL DATA: Screening recall for a possible left breast
asymmetry.

EXAM:
DIGITAL DIAGNOSTIC UNILATERAL LEFT MAMMOGRAM WITH TOMOSYNTHESIS AND
CAD
TECHNIQUE: Left digital diagnostic mammography and breast tomosynthesis was
performed. The images were evaluated with computer-aided detection.

[L MLO synth-2D (1 of 3)]
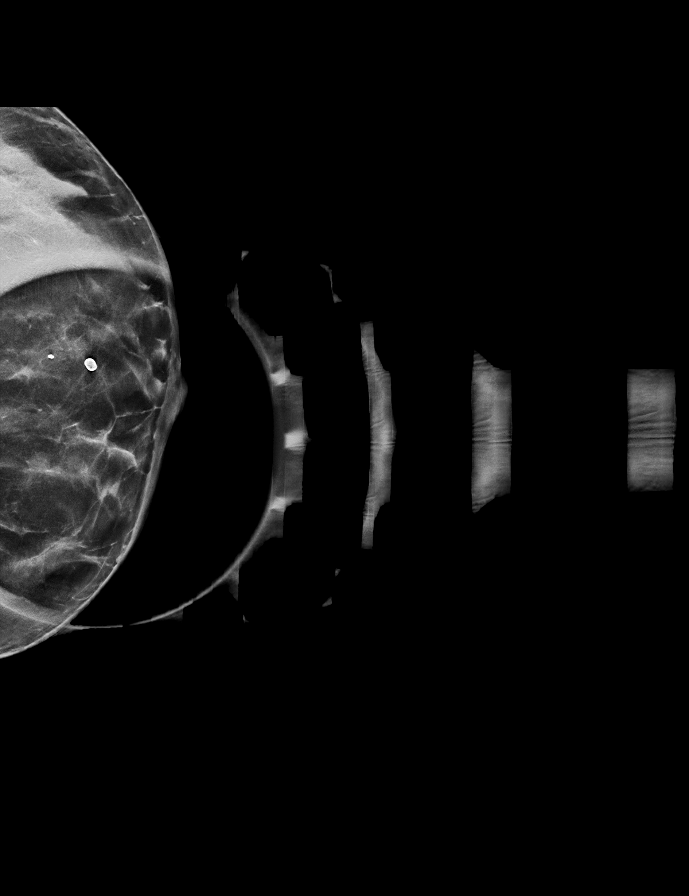

[L MLO synth-2D (2 of 3)]
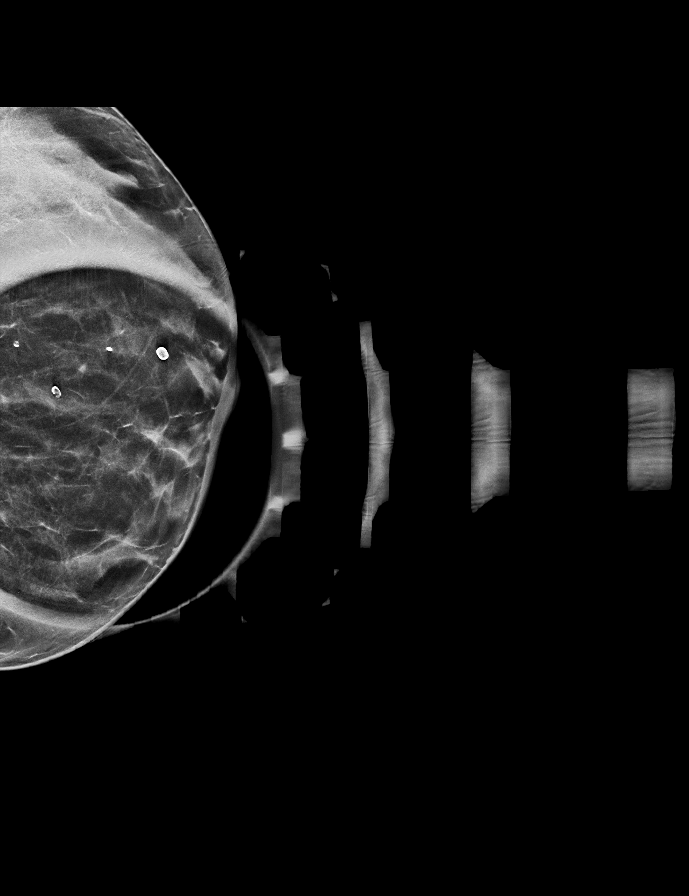

[L ML synth-2D]
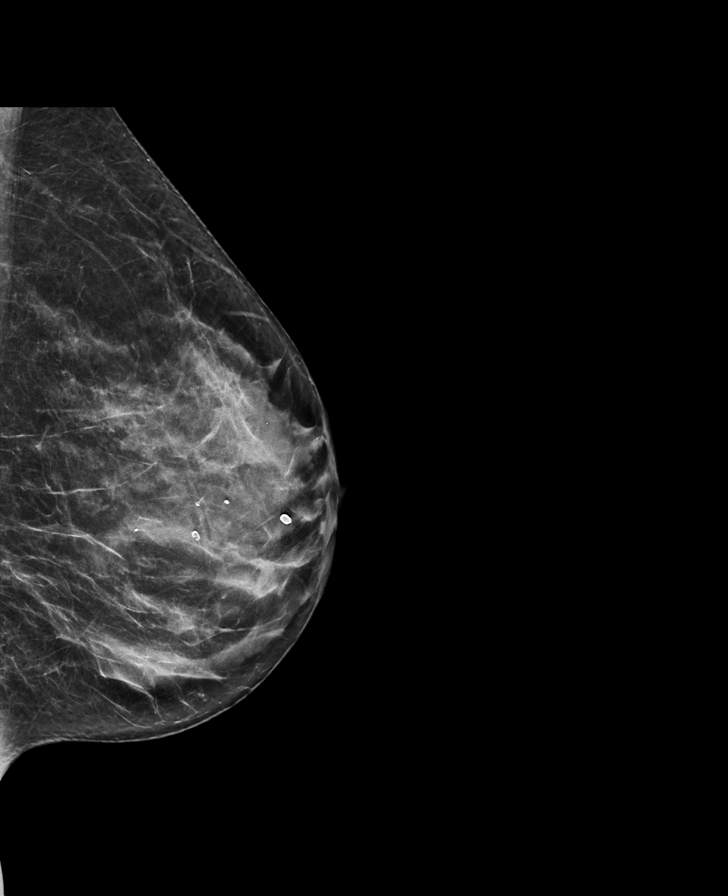

[L MLO synth-2D (3 of 3)]
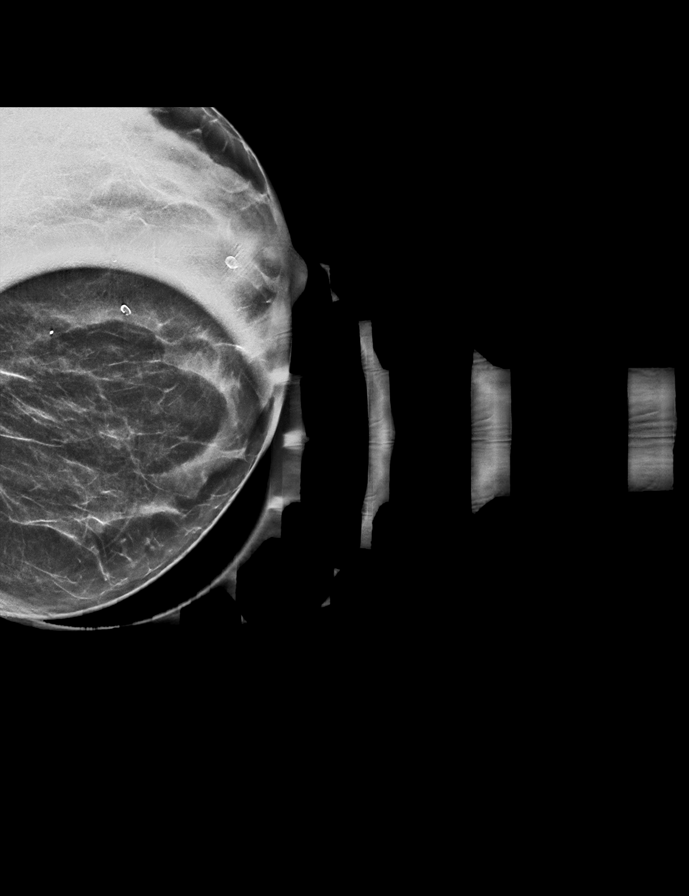

[L ML tomo · tomo slice 33/65.0]
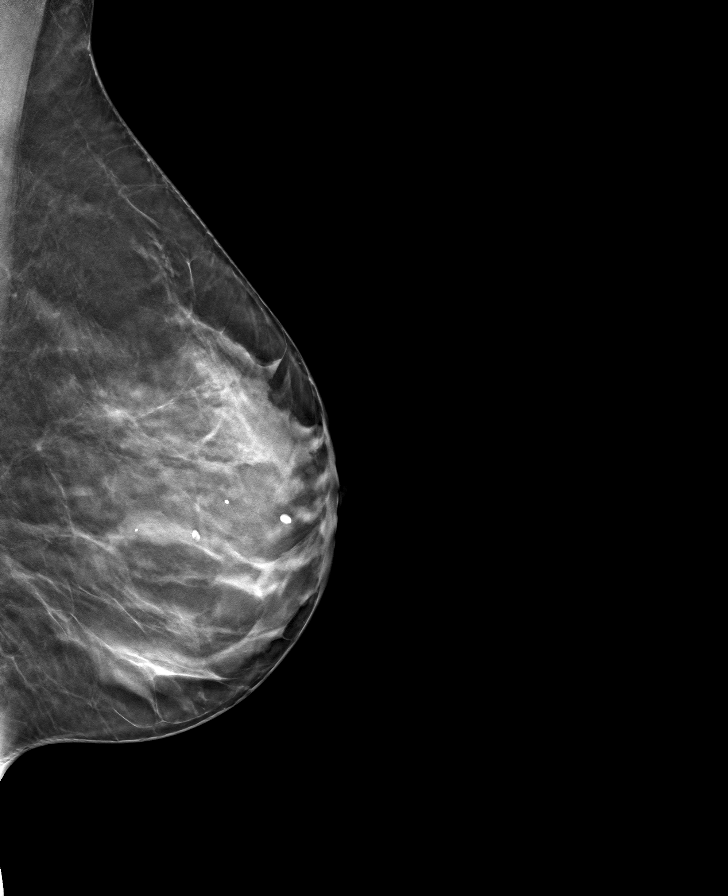

[L MLO tomo (1 of 3) · tomo slice 25/48.0]
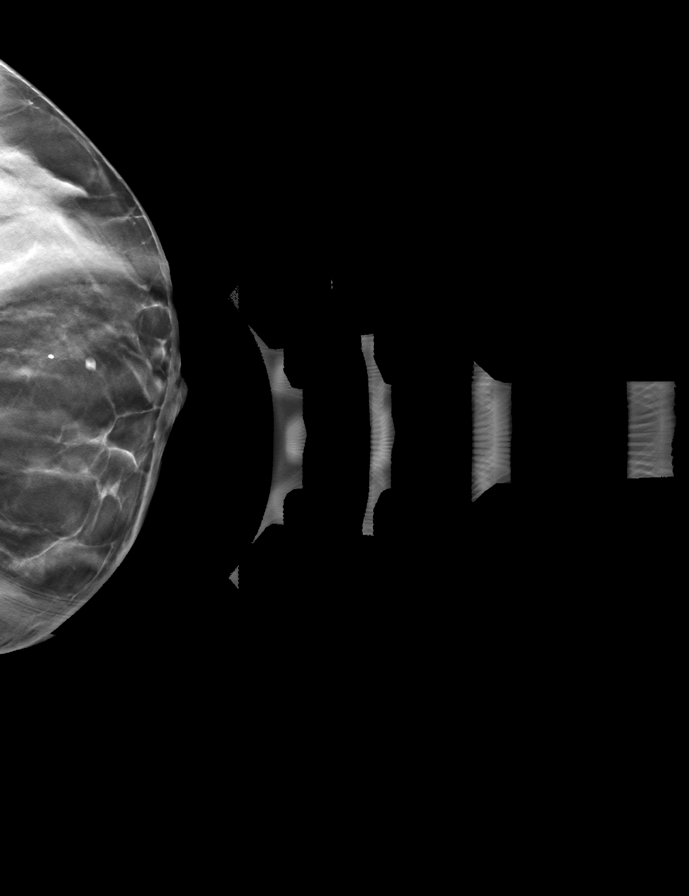

[L MLO tomo (2 of 3) · tomo slice 27/53.0]
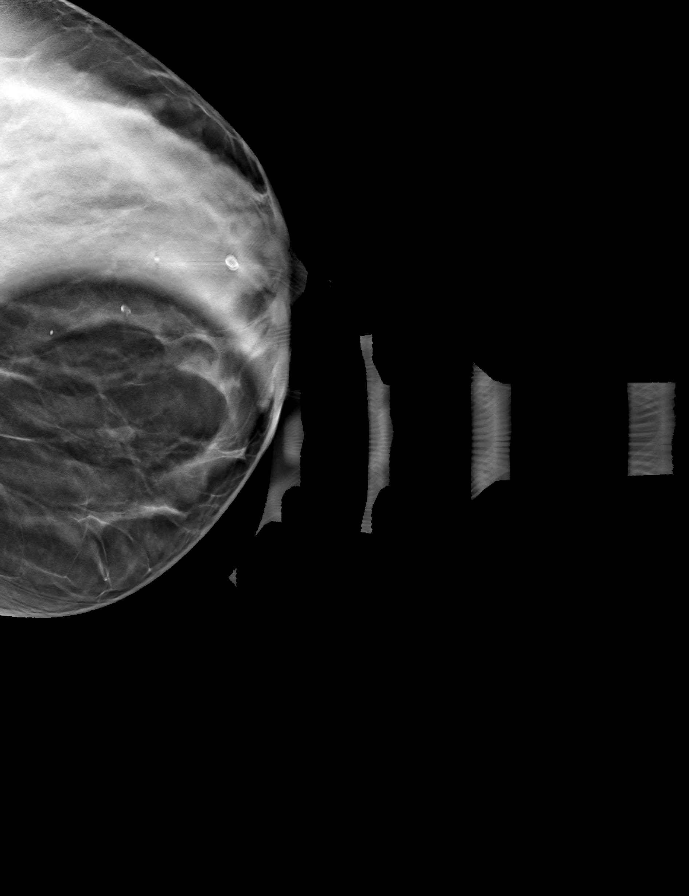

[L MLO tomo (3 of 3) · tomo slice 26/51.0]
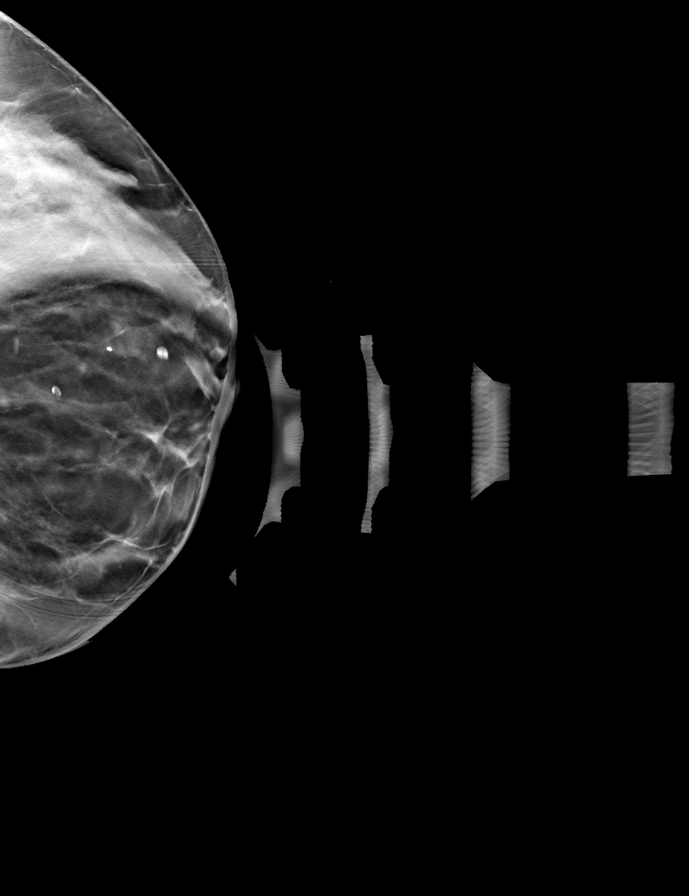

[8 of 24 positions shown; findings below may reference images not displayed]

ACR Breast Density Category c: The breast tissue is heterogeneously
dense, which may obscure small masses.
FINDINGS: On the diagnostic spot-compression images, the possible asymmetry in
the left breast disperses consistent with superimposed
fibroglandular tissue. There is no underlying mass or significant
residual asymmetry. There is no architectural distortion and there
are no suspicious calcifications.
IMPRESSION: No evidence of breast malignancy.

RECOMMENDATION:
Screening mammogram in one year.(Code:[id number])

I have discussed the findings and recommendations with the patient.
If applicable, a reminder letter will be sent to the patient
regarding the next appointment.

BI-RADS CATEGORY  1: Negative.

## 2023-09-24 DIAGNOSIS — R55 Syncope and collapse: Secondary | ICD-10-CM | POA: Diagnosis not present

## 2023-10-29 ENCOUNTER — Other Ambulatory Visit (HOSPITAL_COMMUNITY): Payer: Self-pay

## 2023-10-29 MED ORDER — VALACYCLOVIR HCL 1 G PO TABS
2000.0000 mg | ORAL_TABLET | Freq: Two times a day (BID) | ORAL | 5 refills | Status: AC
  Filled 2023-10-29: qty 12, 3d supply, fill #0

## 2023-10-29 MED ORDER — FEMRING 0.05 MG/24HR VA RING
VAGINAL_RING | VAGINAL | 3 refills | Status: DC
  Filled 2023-10-29: qty 1, 90d supply, fill #0

## 2023-10-31 ENCOUNTER — Other Ambulatory Visit (HOSPITAL_COMMUNITY): Payer: Self-pay

## 2023-11-04 ENCOUNTER — Other Ambulatory Visit (HOSPITAL_COMMUNITY): Payer: Self-pay

## 2023-11-04 MED ORDER — FEMRING 0.05 MG/24HR VA RING
VAGINAL_RING | VAGINAL | 0 refills | Status: DC
  Filled 2023-11-04: qty 1, 90d supply, fill #0

## 2023-11-05 ENCOUNTER — Other Ambulatory Visit (HOSPITAL_COMMUNITY): Payer: Self-pay

## 2023-11-10 ENCOUNTER — Other Ambulatory Visit (HOSPITAL_COMMUNITY): Payer: Self-pay

## 2023-11-12 ENCOUNTER — Other Ambulatory Visit (HOSPITAL_COMMUNITY): Payer: Self-pay

## 2023-12-03 DEATH — deceased

## 2023-12-09 ENCOUNTER — Other Ambulatory Visit (HOSPITAL_COMMUNITY): Payer: Self-pay

## 2023-12-09 DIAGNOSIS — Z6825 Body mass index (BMI) 25.0-25.9, adult: Secondary | ICD-10-CM | POA: Diagnosis not present

## 2023-12-09 DIAGNOSIS — Z01419 Encounter for gynecological examination (general) (routine) without abnormal findings: Secondary | ICD-10-CM | POA: Diagnosis not present

## 2023-12-09 DIAGNOSIS — Z124 Encounter for screening for malignant neoplasm of cervix: Secondary | ICD-10-CM | POA: Diagnosis not present

## 2023-12-09 DIAGNOSIS — Z1231 Encounter for screening mammogram for malignant neoplasm of breast: Secondary | ICD-10-CM | POA: Diagnosis not present

## 2023-12-09 MED ORDER — FEMRING 0.05 MG/24HR VA RING
VAGINAL_RING | VAGINAL | 3 refills | Status: DC
  Filled 2023-12-09: qty 1, 84d supply, fill #0
  Filled 2024-02-06 – 2024-02-17 (×2): qty 1, 90d supply, fill #0
  Filled 2024-04-20: qty 1, 90d supply, fill #1
  Filled 2024-08-04: qty 1, 90d supply, fill #2
  Filled 2024-10-21: qty 1, 90d supply, fill #3

## 2023-12-09 MED ORDER — VALACYCLOVIR HCL 1 G PO TABS
2000.0000 mg | ORAL_TABLET | Freq: Two times a day (BID) | ORAL | 2 refills | Status: AC
  Filled 2023-12-09: qty 30, 7d supply, fill #0
  Filled 2024-02-06 – 2024-02-17 (×2): qty 30, 8d supply, fill #0
  Filled 2024-02-19 – 2024-03-05 (×3): qty 30, 8d supply, fill #1
  Filled 2024-03-06 – 2024-03-23 (×5): qty 30, 8d supply, fill #2

## 2023-12-10 ENCOUNTER — Other Ambulatory Visit (HOSPITAL_COMMUNITY): Payer: Self-pay

## 2023-12-10 DIAGNOSIS — Z83719 Family history of colon polyps, unspecified: Secondary | ICD-10-CM | POA: Diagnosis not present

## 2023-12-10 DIAGNOSIS — Z1211 Encounter for screening for malignant neoplasm of colon: Secondary | ICD-10-CM | POA: Diagnosis not present

## 2023-12-16 DIAGNOSIS — Z13820 Encounter for screening for osteoporosis: Secondary | ICD-10-CM | POA: Diagnosis not present

## 2023-12-22 ENCOUNTER — Other Ambulatory Visit (HOSPITAL_COMMUNITY): Payer: Self-pay

## 2023-12-22 DIAGNOSIS — M9905 Segmental and somatic dysfunction of pelvic region: Secondary | ICD-10-CM | POA: Diagnosis not present

## 2023-12-22 DIAGNOSIS — M9904 Segmental and somatic dysfunction of sacral region: Secondary | ICD-10-CM | POA: Diagnosis not present

## 2023-12-22 DIAGNOSIS — M9902 Segmental and somatic dysfunction of thoracic region: Secondary | ICD-10-CM | POA: Diagnosis not present

## 2023-12-22 DIAGNOSIS — M9903 Segmental and somatic dysfunction of lumbar region: Secondary | ICD-10-CM | POA: Diagnosis not present

## 2023-12-23 ENCOUNTER — Other Ambulatory Visit (HOSPITAL_COMMUNITY): Payer: Self-pay

## 2023-12-29 DIAGNOSIS — R5383 Other fatigue: Secondary | ICD-10-CM | POA: Diagnosis not present

## 2023-12-29 DIAGNOSIS — Z789 Other specified health status: Secondary | ICD-10-CM | POA: Diagnosis not present

## 2023-12-29 DIAGNOSIS — E538 Deficiency of other specified B group vitamins: Secondary | ICD-10-CM | POA: Diagnosis not present

## 2023-12-29 DIAGNOSIS — E7800 Pure hypercholesterolemia, unspecified: Secondary | ICD-10-CM | POA: Diagnosis not present

## 2024-01-13 ENCOUNTER — Other Ambulatory Visit: Payer: Self-pay | Admitting: Internal Medicine

## 2024-01-13 ENCOUNTER — Ambulatory Visit
Admission: RE | Admit: 2024-01-13 | Discharge: 2024-01-13 | Disposition: A | Discharge status: Home / Self Care | Payer: 59 | Source: Ambulatory Visit | Attending: Internal Medicine | Admitting: Internal Medicine

## 2024-01-13 DIAGNOSIS — M545 Low back pain, unspecified: Secondary | ICD-10-CM

## 2024-01-15 DIAGNOSIS — L989 Disorder of the skin and subcutaneous tissue, unspecified: Secondary | ICD-10-CM | POA: Diagnosis not present

## 2024-01-15 DIAGNOSIS — G8929 Other chronic pain: Secondary | ICD-10-CM | POA: Diagnosis not present

## 2024-02-06 ENCOUNTER — Other Ambulatory Visit: Payer: Self-pay

## 2024-02-06 ENCOUNTER — Other Ambulatory Visit (HOSPITAL_COMMUNITY): Payer: Self-pay

## 2024-02-10 ENCOUNTER — Other Ambulatory Visit: Payer: Self-pay

## 2024-02-10 ENCOUNTER — Encounter: Payer: Self-pay | Admitting: Pharmacist

## 2024-02-13 ENCOUNTER — Other Ambulatory Visit: Payer: Self-pay

## 2024-02-17 ENCOUNTER — Other Ambulatory Visit (HOSPITAL_COMMUNITY): Payer: Self-pay

## 2024-02-19 ENCOUNTER — Other Ambulatory Visit (HOSPITAL_COMMUNITY): Payer: Self-pay

## 2024-03-04 ENCOUNTER — Other Ambulatory Visit (HOSPITAL_COMMUNITY): Payer: Self-pay

## 2024-03-06 ENCOUNTER — Other Ambulatory Visit (HOSPITAL_COMMUNITY): Payer: Self-pay

## 2024-03-08 ENCOUNTER — Other Ambulatory Visit: Payer: Self-pay

## 2024-03-09 ENCOUNTER — Other Ambulatory Visit (HOSPITAL_COMMUNITY): Payer: Self-pay

## 2024-03-11 ENCOUNTER — Other Ambulatory Visit (HOSPITAL_COMMUNITY): Payer: Self-pay

## 2024-03-22 ENCOUNTER — Other Ambulatory Visit (HOSPITAL_COMMUNITY): Payer: Self-pay

## 2024-04-20 ENCOUNTER — Other Ambulatory Visit (HOSPITAL_COMMUNITY): Payer: Self-pay

## 2024-04-20 ENCOUNTER — Other Ambulatory Visit: Payer: Self-pay

## 2024-04-21 ENCOUNTER — Other Ambulatory Visit: Payer: Self-pay

## 2024-06-29 ENCOUNTER — Other Ambulatory Visit (HOSPITAL_COMMUNITY): Payer: Self-pay

## 2024-06-29 DIAGNOSIS — R0981 Nasal congestion: Secondary | ICD-10-CM | POA: Diagnosis not present

## 2024-06-29 DIAGNOSIS — J329 Chronic sinusitis, unspecified: Secondary | ICD-10-CM | POA: Diagnosis not present

## 2024-06-29 MED ORDER — AMOXICILLIN-POT CLAVULANATE 875-125 MG PO TABS
1.0000 | ORAL_TABLET | Freq: Two times a day (BID) | ORAL | 0 refills | Status: AC
  Filled 2024-06-29: qty 20, 10d supply, fill #0

## 2024-08-04 ENCOUNTER — Other Ambulatory Visit (HOSPITAL_COMMUNITY): Payer: Self-pay

## 2024-09-23 DIAGNOSIS — Z23 Encounter for immunization: Secondary | ICD-10-CM | POA: Diagnosis not present

## 2024-10-13 ENCOUNTER — Other Ambulatory Visit (HOSPITAL_BASED_OUTPATIENT_CLINIC_OR_DEPARTMENT_OTHER): Payer: Self-pay

## 2024-10-21 ENCOUNTER — Other Ambulatory Visit (HOSPITAL_COMMUNITY): Payer: Self-pay

## 2024-10-22 ENCOUNTER — Other Ambulatory Visit (HOSPITAL_COMMUNITY): Payer: Self-pay

## 2024-12-26 ENCOUNTER — Other Ambulatory Visit (HOSPITAL_COMMUNITY): Payer: Self-pay

## 2024-12-26 MED ORDER — PROGESTERONE MICRONIZED 100 MG PO CAPS
200.0000 mg | ORAL_CAPSULE | Freq: Every day | ORAL | 3 refills | Status: AC
  Filled 2024-12-26: qty 90, 45d supply, fill #0

## 2024-12-26 MED ORDER — FEMRING 0.05 MG/24HR VA RING
1.0000 | VAGINAL_RING | VAGINAL | 3 refills | Status: AC
  Filled 2024-12-26: qty 1, 90d supply, fill #0

## 2024-12-26 MED ORDER — DOXYCYCLINE MONOHYDRATE 100 MG PO CAPS
100.0000 mg | ORAL_CAPSULE | Freq: Two times a day (BID) | ORAL | 0 refills | Status: AC
  Filled 2024-12-26: qty 14, 7d supply, fill #0

## 2024-12-27 ENCOUNTER — Other Ambulatory Visit: Payer: Self-pay

## 2024-12-28 ENCOUNTER — Other Ambulatory Visit: Payer: Self-pay
